# Patient Record
Sex: Female | Born: 1995 | Race: White | Hispanic: No | Marital: Married | Smoking: Never smoker
Health system: Southern US, Community
[De-identification: ages and names within clinical notes are randomized; demographics above are authoritative.]

## PROBLEM LIST (undated history)

## (undated) DIAGNOSIS — F419 Anxiety disorder, unspecified: Secondary | ICD-10-CM

## (undated) HISTORY — DX: Anxiety disorder, unspecified: F41.9

## (undated) HISTORY — PX: WISDOM TOOTH EXTRACTION: SHX21

---

## 2020-01-06 LAB — OB RESULTS CONSOLE HIV ANTIBODY (ROUTINE TESTING): HIV: NONREACTIVE

## 2020-01-06 LAB — OB RESULTS CONSOLE ABO/RH: RH Type: POSITIVE

## 2020-01-06 LAB — OB RESULTS CONSOLE RUBELLA ANTIBODY, IGM: Rubella: IMMUNE

## 2020-01-06 LAB — OB RESULTS CONSOLE RPR: RPR: NONREACTIVE

## 2020-01-06 LAB — OB RESULTS CONSOLE HEPATITIS B SURFACE ANTIGEN: Hepatitis B Surface Ag: NEGATIVE

## 2020-01-06 LAB — OB RESULTS CONSOLE ANTIBODY SCREEN: Antibody Screen: NEGATIVE

## 2020-01-12 ENCOUNTER — Inpatient Hospital Stay (HOSPITAL_COMMUNITY)
Admission: AD | Admit: 2020-01-12 | Payer: BC Managed Care – PPO | Source: Home / Self Care | Admitting: Obstetrics and Gynecology

## 2020-01-20 LAB — OB RESULTS CONSOLE GC/CHLAMYDIA
Chlamydia: NEGATIVE
Gonorrhea: NEGATIVE

## 2020-04-15 ENCOUNTER — Encounter: Payer: Self-pay | Admitting: *Deleted

## 2020-04-20 ENCOUNTER — Ambulatory Visit: Payer: 59 | Admitting: *Deleted

## 2020-04-20 ENCOUNTER — Encounter: Payer: Self-pay | Admitting: *Deleted

## 2020-04-20 ENCOUNTER — Other Ambulatory Visit (HOSPITAL_COMMUNITY): Payer: Self-pay | Admitting: Obstetrics and Gynecology

## 2020-04-20 ENCOUNTER — Ambulatory Visit: Payer: 59 | Attending: Obstetrics and Gynecology

## 2020-04-20 ENCOUNTER — Other Ambulatory Visit: Payer: Self-pay

## 2020-04-20 DIAGNOSIS — Z3A23 23 weeks gestation of pregnancy: Secondary | ICD-10-CM | POA: Diagnosis not present

## 2020-04-20 DIAGNOSIS — O99212 Obesity complicating pregnancy, second trimester: Secondary | ICD-10-CM | POA: Insufficient documentation

## 2020-04-20 DIAGNOSIS — E669 Obesity, unspecified: Secondary | ICD-10-CM | POA: Diagnosis not present

## 2020-04-20 DIAGNOSIS — Z363 Encounter for antenatal screening for malformations: Secondary | ICD-10-CM | POA: Diagnosis not present

## 2020-06-01 ENCOUNTER — Encounter (HOSPITAL_COMMUNITY): Payer: Self-pay | Admitting: Obstetrics and Gynecology

## 2020-06-01 ENCOUNTER — Other Ambulatory Visit: Payer: Self-pay

## 2020-06-01 ENCOUNTER — Inpatient Hospital Stay (HOSPITAL_COMMUNITY)
Admission: AD | Admit: 2020-06-01 | Discharge: 2020-06-01 | Disposition: A | Discharge status: Home / Self Care | Payer: 59 | Attending: Obstetrics and Gynecology | Admitting: Obstetrics and Gynecology

## 2020-06-01 DIAGNOSIS — O99343 Other mental disorders complicating pregnancy, third trimester: Secondary | ICD-10-CM | POA: Diagnosis not present

## 2020-06-01 DIAGNOSIS — Z79899 Other long term (current) drug therapy: Secondary | ICD-10-CM | POA: Insufficient documentation

## 2020-06-01 DIAGNOSIS — O212 Late vomiting of pregnancy: Secondary | ICD-10-CM | POA: Insufficient documentation

## 2020-06-01 DIAGNOSIS — Z3A29 29 weeks gestation of pregnancy: Secondary | ICD-10-CM | POA: Insufficient documentation

## 2020-06-01 DIAGNOSIS — U071 COVID-19: Secondary | ICD-10-CM | POA: Insufficient documentation

## 2020-06-01 DIAGNOSIS — O98513 Other viral diseases complicating pregnancy, third trimester: Secondary | ICD-10-CM

## 2020-06-01 DIAGNOSIS — F419 Anxiety disorder, unspecified: Secondary | ICD-10-CM | POA: Diagnosis not present

## 2020-06-01 LAB — URINALYSIS, ROUTINE W REFLEX MICROSCOPIC
Glucose, UA: 100 mg/dL — AB
Hgb urine dipstick: NEGATIVE
Ketones, ur: >80 mg/dL — AB
Nitrite: NEGATIVE
Protein, ur: 100 mg/dL — AB
Specific Gravity, Urine: 1.015 (ref 1.005–1.030)
pH: 6.5 (ref 5.0–8.0)

## 2020-06-01 LAB — URINALYSIS, MICROSCOPIC (REFLEX): Squamous Epithelial / HPF: >50 (ref 0–5)

## 2020-06-01 LAB — SARS CORONAVIRUS 2 BY RT PCR (HOSPITAL ORDER, PERFORMED IN ~~LOC~~ HOSPITAL LAB): SARS Coronavirus 2: POSITIVE — AB

## 2020-06-01 MED ORDER — ONDANSETRON HCL 4 MG PO TABS: 4.0000 mg | ORAL_TABLET | Freq: Three times a day (TID) | ORAL | 0 refills | Status: DC | PRN

## 2020-06-01 MED ORDER — ONDANSETRON 4 MG PO TBDP: 4.0000 mg | ORAL_TABLET | Freq: Four times a day (QID) | ORAL | 0 refills | Status: DC | PRN

## 2020-06-01 MED ORDER — SODIUM CHLORIDE 0.9 % IV SOLN
INTRAVENOUS | Status: DC | PRN
  Administered 2020-06-01: 1000 mL via INTRAVENOUS

## 2020-06-01 MED ORDER — METHYLPREDNISOLONE SODIUM SUCC 125 MG IJ SOLR: 125.0000 mg | Freq: Once | INTRAMUSCULAR | Status: DC | PRN

## 2020-06-01 MED ORDER — LACTATED RINGERS IV SOLN: INTRAVENOUS | Status: DC

## 2020-06-01 MED ORDER — EPINEPHRINE 0.3 MG/0.3ML IJ SOAJ
0.3000 mg | Freq: Once | INTRAMUSCULAR | Status: DC | PRN
  Filled 2020-06-01: qty 0.6

## 2020-06-01 MED ORDER — DIPHENHYDRAMINE HCL 50 MG/ML IJ SOLN: 50.0000 mg | Freq: Once | INTRAMUSCULAR | Status: DC | PRN

## 2020-06-01 MED ORDER — ONDANSETRON HCL 4 MG/2ML IJ SOLN
4.0000 mg | Freq: Once | INTRAMUSCULAR | Status: AC
  Administered 2020-06-01: 4 mg via INTRAVENOUS
  Filled 2020-06-01: qty 2

## 2020-06-01 MED ORDER — FAMOTIDINE 20 MG PO TABS: 20.0000 mg | ORAL_TABLET | Freq: Two times a day (BID) | ORAL | 1 refills | Status: AC

## 2020-06-01 MED ORDER — ALBUTEROL SULFATE HFA 108 (90 BASE) MCG/ACT IN AERS
2.0000 | INHALATION_SPRAY | Freq: Once | RESPIRATORY_TRACT | Status: DC | PRN
  Filled 2020-06-01: qty 6.7

## 2020-06-01 MED ORDER — SODIUM CHLORIDE 0.9 % IV SOLN
1200.0000 mg | Freq: Once | INTRAVENOUS | Status: AC
  Administered 2020-06-01: 1200 mg via INTRAVENOUS
  Filled 2020-06-01: qty 10

## 2020-06-01 MED ORDER — FAMOTIDINE IN NACL 20-0.9 MG/50ML-% IV SOLN
20.0000 mg | Freq: Once | INTRAVENOUS | Status: AC | PRN
  Administered 2020-06-01: 20 mg via INTRAVENOUS
  Filled 2020-06-01: qty 50

## 2020-06-01 MED ORDER — LACTATED RINGERS IV BOLUS
1000.0000 mL | Freq: Once | INTRAVENOUS | Status: AC
  Administered 2020-06-01: 1000 mL via INTRAVENOUS

## 2020-06-01 NOTE — Discharge Instructions (Signed)
COVID-19 Frequently Asked Questions COVID-19 (coronavirus disease) is an infection that is caused by a large family of viruses. Some viruses cause illness in people and others cause illness in animals like camels, cats, and bats. In some cases, the viruses that cause illness in animals can spread to humans. Where did the coronavirus come from? In December 2019, [place] told the [facility]) of several cases of lung disease (human respiratory illness). These cases were linked to an open seafood and livestock market in the city of [place]. The link to the seafood and livestock market suggests that the virus may have spread from animals to humans. However, since that first outbreak in December, the virus has also been shown to spread from person to person. What is the name of the disease and the virus? Disease name Early on, this disease was called novel coronavirus. This is because scientists determined that the disease was caused by a new (novel) respiratory virus. The World Health Organization [facility]) has now named the disease COVID-19, or coronavirus disease. Virus name The virus that causes the disease is called severe acute respiratory syndrome coronavirus 2 (SARS-CoV-2). More information on disease and virus naming World Health Organization [facility]): www.who.int/emergencies/diseases/novel-coronavirus-2019/technical-guidance/naming-the-coronavirus-disease-(covid-2019)-and-the-virus-that-causes-it Who is at risk for complications from coronavirus disease? Some people may be at higher risk for complications from coronavirus disease. This includes older adults and people who have chronic diseases, such as heart disease, diabetes, and lung disease. If you are at higher risk for complications, take these extra precautions:  Stay home as much as possible.  Avoid social gatherings and travel.  Avoid close contact with others. Stay at least 6 ft (2 m) away from others, if possible.  Wash  your hands often with soap and water for at least 20 seconds.  Avoid touching your face, mouth, nose, or eyes.  Keep supplies on hand at home, such as food, medicine, and cleaning supplies.  If you must go out in public, wear a cloth face covering or face mask. Make sure your mask covers your nose and mouth. How does coronavirus disease spread? The virus that causes coronavirus disease spreads easily from person to person (is contagious). You may catch the virus by:  Breathing in droplets from an infected person. Droplets can be spread by a person breathing, speaking, singing, coughing, or sneezing.  Touching something, like a table or a doorknob, that was exposed to the virus (contaminated) and then touching your mouth, nose, or eyes. Can I get the virus from touching surfaces or objects? There is still a lot that we do not know about the virus that causes coronavirus disease. Scientists are basing a lot of information on what they know about similar viruses, such as:  Viruses cannot generally survive on surfaces for long. They need a human body (host) to survive.  It is more likely that the virus is spread by close contact with people who are sick (direct contact), such as through: ? Shaking hands or hugging. ? Breathing in respiratory droplets that travel through the air. Droplets can be spread by a person breathing, speaking, singing, coughing, or sneezing.  It is less likely that the virus is spread when a person touches a surface or object that has the virus on it (indirect contact). The virus may be able to enter the body if the person touches a surface or object and then touches his or her face, eyes, nose, or mouth. Can a person spread the virus without having symptoms of the disease?  It may be possible for the virus to spread before a person has symptoms of the disease, but this is most likely not the main way the virus is spreading. It is more likely for the virus to spread by  being in close contact with people who are sick and breathing in the respiratory droplets spread by a person breathing, speaking, singing, coughing, or sneezing. °What are the symptoms of coronavirus disease? °Symptoms vary from person to person and can range from mild to severe. Symptoms may include: °· Fever or chills. °· Cough. °· Difficulty breathing or feeling short of breath. °· Headaches, body aches, or muscle aches. °· Runny or stuffy (congested) nose. °· Sore throat. °· New loss of taste or smell. °· Nausea, vomiting, or diarrhea. °These symptoms can appear anywhere from 2 to 14 days after you have been exposed to the virus. Some people may not have any symptoms. If you develop symptoms, call your health care provider. People with severe symptoms may need hospital care. °Should I be tested for this virus? °Your health care provider will decide whether to test you based on your symptoms, history of exposure, and your risk factors. °How does a health care provider test for this virus? °Health care providers will collect samples to send for testing. Samples may include: °· Taking a swab of fluid from the back of your nose and throat, your nose, or your throat. °· Taking fluid from the lungs by having you cough up mucus (sputum) into a sterile cup. °· Taking a blood sample. °Is there a treatment or vaccine for this virus? °Currently, there is no vaccine to prevent coronavirus disease. Also, there are no medicines like antibiotics or antivirals to treat the virus. A person who becomes sick is given supportive care, which means rest and fluids. A person may also relieve his or her symptoms by using over-the-counter medicines that treat sneezing, coughing, and runny nose. These are the same medicines that a person takes for the common cold. °If you develop symptoms, call your health care provider. People with severe symptoms may need hospital care. °What can I do to protect myself and my family from this  virus? ° °  ° °You can protect yourself and your family by taking the same actions that you would take to prevent the spread of other viruses. Take the following actions: °· Wash your hands often with soap and water for at least 20 seconds. If soap and water are not available, use alcohol-based hand sanitizer. °· Avoid touching your face, mouth, nose, or eyes. °· Cough or sneeze into a tissue, sleeve, or elbow. Do not cough or sneeze into your hand or the air. °? If you cough or sneeze into a tissue, throw it away immediately and wash your hands. °· Disinfect objects and surfaces that you frequently touch every day. °· Stay away from people who are sick. °· Avoid going out in public, follow guidance from your state and local health authorities. °· Avoid crowded indoor spaces. Stay at least 6 ft (2 m) away from others. °· If you must go out in public, wear a cloth face covering or face mask. Make sure your mask covers your nose and mouth. °· Stay home if you are sick, except to get medical care. Call your health care provider before you get medical care. Your health care provider will tell you how long to stay home. °· Make sure your vaccines are up to date. Ask your health care provider what   vaccines you need. °What should I do if I need to travel? °Follow travel recommendations from your local health authority, the CDC, and WHO. °Travel information and advice °· Centers for Disease Control and Prevention (CDC): www.cdc.gov/coronavirus/2019-ncov/travelers/index.html °· World Health Organization (WHO): www.who.int/emergencies/diseases/novel-coronavirus-2019/travel-advice °Know the risks and take action to protect your health °· You are at higher risk of getting coronavirus disease if you are traveling to areas with an outbreak or if you are exposed to travelers from areas with an outbreak. °· Wash your hands often and practice good hygiene to lower the risk of catching or spreading the virus. °What should I do if I  am sick? °General instructions to stop the spread of infection °· Wash your hands often with soap and water for at least 20 seconds. If soap and water are not available, use alcohol-based hand sanitizer. °· Cough or sneeze into a tissue, sleeve, or elbow. Do not cough or sneeze into your hand or the air. °· If you cough or sneeze into a tissue, throw it away immediately and wash your hands. °· Stay home unless you must get medical care. Call your health care provider or local health authority before you get medical care. °· Avoid public areas. Do not take public transportation, if possible. °· If you can, wear a mask if you must go out of the house or if you are in close contact with someone who is not sick. Make sure your mask covers your nose and mouth. °Keep your home clean °· Disinfect objects and surfaces that are frequently touched every day. This may include: °? Counters and tables. °? Doorknobs and light switches. °? Sinks and faucets. °? Electronics such as phones, remote controls, keyboards, computers, and tablets. °· Wash dishes in hot, soapy water or use a dishwasher. Air-dry your dishes. °· Wash laundry in hot water. °Prevent infecting other household members °· Let healthy household members care for children and pets, if possible. If you have to care for children or pets, wash your hands often and wear a mask. °· Sleep in a different bedroom or bed, if possible. °· Do not share personal items, such as razors, toothbrushes, deodorant, combs, brushes, towels, and washcloths. °Where to find more information °Centers for Disease Control and Prevention (CDC) °· Information and news updates: www.cdc.gov/coronavirus/2019-ncov °World Health Organization (WHO) °· Information and news updates: www.who.int/emergencies/diseases/novel-coronavirus-2019 °· Coronavirus health topic: www.who.int/health-topics/coronavirus °· Questions and answers on COVID-19: www.who.int/news-room/q-a-detail/q-a-coronaviruses °· Global  tracker: who.sprinklr.com °American Academy of Pediatrics (AAP) °· Information for families: www.healthychildren.org/English/health-issues/conditions/chest-lungs/Pages/2019-Novel-Coronavirus.aspx °The coronavirus situation is changing rapidly. Check your local health authority website or the CDC and WHO websites for updates and news. °When should I contact a health care provider? °· Contact your health care provider if you have symptoms of an infection, such as fever or cough, and you: °? Have been near anyone who is known to have coronavirus disease. °? Have come into contact with a person who is suspected to have coronavirus disease. °? Have traveled to an area where there is an outbreak of COVID-19. °When should I get emergency medical care? °· Get help right away by calling your local emergency services (911 in the [place] if you have: °? Trouble breathing. °? Pain or pressure in your chest. °? Confusion. °? Blue-tinged lips and fingernails. °? Difficulty waking from sleep. °? Symptoms that get worse. °Let the emergency medical personnel know if you think you have coronavirus disease. °Summary °· A new respiratory virus is spreading from person to person and causing   COVID-19 (coronavirus disease).  The virus that causes COVID-19 appears to spread easily. It spreads from one person to another through droplets from breathing, speaking, singing, coughing, or sneezing.  Older adults and those with chronic diseases are at higher risk of disease. If you are at higher risk for complications, take extra precautions.  There is currently no vaccine to prevent coronavirus disease. There are no medicines, such as antibiotics or antivirals, to treat the virus.  You can protect yourself and your family by washing your hands often, avoiding touching your face, and covering your coughs and sneezes. This information is not intended to replace advice given to you by your health care provider. Make sure you discuss any  questions you have with your health care provider. Document Revised: 07/03/2019 Document Reviewed: 12/30/2018 Elsevier Patient Education  2020 Elsevier Inc.  10 Things You Can Do to Manage Your COVID-19 Symptoms at Home If you have possible or confirmed COVID-19: 1. Stay home from work and school. And stay away from other public places. If you must go out, avoid using any kind of public transportation, ridesharing, or taxis. 2. Monitor your symptoms carefully. If your symptoms get worse, call your healthcare provider immediately. 3. Get rest and stay hydrated. 4. If you have a medical appointment, call the healthcare provider ahead of time and tell them that you have or may have COVID-19. 5. For medical emergencies, call 911 and notify the dispatch personnel that you have or may have COVID-19. 6. Cover your cough and sneezes with a tissue or use the inside of your elbow. 7. Wash your hands often with soap and water for at least 20 seconds or clean your hands with an alcohol-based hand sanitizer that contains at least 60% alcohol. 8. As much as possible, stay in a specific room and away from other people in your home. Also, you should use a separate bathroom, if available. If you need to be around other people in or outside of the home, wear a mask. 9. Avoid sharing personal items with other people in your household, like dishes, towels, and bedding. 10. Clean all surfaces that are touched often, like counters, tabletops, and doorknobs. Use household cleaning sprays or wipes according to the label instructions. [email or web address] 03/18/2019 This information is not intended to replace advice given to you by your health care provider. Make sure you discuss any questions you have with your health care provider. Document Revised: 08/20/2019 Document Reviewed: 08/20/2019 Elsevier Patient Education  2020 Elsevier Inc.  COVID-19: Quarantine vs. Isolation QUARANTINE keeps someone who was in close  contact with someone who has COVID-19 away from others. If you had close contact with a person who has COVID-19  Stay home until 14 days after your last contact.  Check your temperature twice a day and watch for symptoms of COVID-19.  If possible, stay away from people who are at higher-risk for getting very sick from COVID-19. ISOLATION keeps someone who is sick or tested positive for COVID-19 without symptoms away from others, even in their own home. If you are sick and think or know you have COVID-19  Stay home until after ? At least 10 days since symptoms first appeared and ? At least 24 hours with no fever without fever-reducing medication and ? Symptoms have improved If you tested positive for COVID-19 but do not have symptoms  Stay home until after ? 10 days have passed since your positive test If you live with others, stay in a specific "sick room"  or area and away from other people or animals, including pets. Use a separate bathroom, if available. [email or web address] 04/06/2019 This information is not intended to replace advice given to you by your health care provider. Make sure you discuss any questions you have with your health care provider. Document Revised: 08/20/2019 Document Reviewed: 08/20/2019 Elsevier Patient Education  2020 [employer].

## 2020-06-01 NOTE — MAU Provider Note (Addendum)
History     CSN: [id number]  Arrival date and time: 06/01/20 1652   First Provider Initiated Contact with Patient 06/01/20 1818      Chief Complaint  Patient presents with  . Nausea  . Emesis   Deanna Russell is a 24 y.o. G1P0 at [redacted]w[redacted]d who receives care at [facility].  She presents today for Nausea and Emesis.  She states she started having cold symptoms last Tuesday or Wednesday.  She states her symptoms started with nasal and chest congestion.  She states she was using a Netty pot and mucinex for her symptoms and they were improving then the vomiting started.  She states on Saturday she had one incident of vomiting, but then had vomiting all day Sunday when she tried to eat. She endorses fetal movement and denies abdominal pain or contractions.  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    OB History    Gravida  1   Para      Term      Preterm      AB      Living        SAB      TAB      Ectopic      Multiple      Live Births              Past Medical History:  Diagnosis Date  . Anxiety     Past Surgical History:  Procedure Laterality Date  . WISDOM TOOTH EXTRACTION      History reviewed. No pertinent family history.  Social History   Tobacco Use  . Smoking status: Never Smoker  . Smokeless tobacco: Never Used  Vaping Use  . Vaping Use: Never used  Substance Use Topics  . Alcohol use: Never  . Drug use: Never    Allergies: No Known Allergies  Medications Prior to Admission  Medication Sig Dispense Refill Last Dose  . doxylamine, Sleep, (UNISOM) 25 MG tablet Take 25 mg by mouth at bedtime as needed.   05/31/2020 at Unknown time  . Prenatal Vit-Fe Fumarate-FA (PRENATAL VITAMIN PO) Take by mouth.   06/01/2020 at Unknown time  . SERTRALINE HCL PO Take by mouth.   06/01/2020 at Unknown time    Review of Systems  Constitutional: Positive for fatigue.  Negative for chills and fever.  HENT: Positive for congestion, sinus pressure and sinus pain. Negative for postnasal drip, rhinorrhea, sneezing and sore throat.   Respiratory: Positive for cough. Negative for chest tightness and shortness of breath.   Cardiovascular: Negative for chest pain.  Gastrointestinal: Positive for nausea and vomiting. Negative for abdominal pain, constipation and diarrhea.  Genitourinary: Negative for difficulty urinating, dysuria, pelvic pain, vaginal bleeding and vaginal discharge.  Musculoskeletal: Negative for back pain.  Neurological: Negative for dizziness, light-headedness and headaches.   Physical Exam   Blood pressure 122/82, pulse (!) 134, temperature 98.2 F (36.8 C), temperature source Oral, resp. rate 16, height 5\' 10"  (1.778 m), weight 122 kg, last menstrual period 11/09/2019, SpO2 96 %.  Physical Exam Constitutional:      Appearance: Normal appearance.  HENT:     Head: Normocephalic and atraumatic.  Eyes:     Conjunctiva/sclera: Conjunctivae normal.  Cardiovascular:     Rate and Rhythm: Normal rate and regular rhythm.     Heart sounds: Normal heart sounds.  Pulmonary:  Effort: Pulmonary effort is normal. No respiratory distress.  Abdominal:     General: Bowel sounds are normal.     Palpations: Abdomen is soft.     Tenderness: There is no abdominal tenderness.  Musculoskeletal:        General: Normal range of motion.     Cervical back: Normal range of motion.  Skin:    General: Skin is warm and dry.  Neurological:     Mental Status: She is alert and oriented to person, place, and time.  Psychiatric:        Mood and Affect: Mood normal.        Behavior: Behavior normal.        Thought Content: Thought content normal.     Fetal Assessment 165 bpm, Mod Var, -Decels, +15x15 Accels Toco: None  MAU Course   Results for orders placed or performed during the hospital encounter of 06/01/20 (from the past 24 hour(s))  Urinalysis,  Routine w reflex microscopic Nasopharyngeal Swab     Status: Abnormal   Collection Time: 06/01/20  5:22 PM  Result Value Ref Range   Color, Urine ORANGE (A) YELLOW   APPearance TURBID (A) CLEAR   Specific Gravity, Urine 1.015 1.005 - 1.030   pH 6.5 5.0 - 8.0   Glucose, UA 100 (A) NEGATIVE mg/dL   Hgb urine dipstick NEGATIVE NEGATIVE   Bilirubin Urine MODERATE (A) NEGATIVE   Ketones, ur >80 (A) NEGATIVE mg/dL   Protein, ur 938 (A) NEGATIVE mg/dL   Nitrite NEGATIVE NEGATIVE   Leukocytes,Ua MODERATE (A) NEGATIVE  Urinalysis, Microscopic (reflex)     Status: Abnormal   Collection Time: 06/01/20  5:22 PM  Result Value Ref Range   RBC / HPF 0-5 0 - 5 RBC/hpf   WBC, UA 21-50 0 - 5 WBC/hpf   Bacteria, UA MANY (A) NONE SEEN   Squamous Epithelial / LPF >50 0 - 5   Mucus PRESENT    Hyaline Casts, UA PRESENT   SARS Coronavirus 2 by RT PCR (hospital order, performed in [facility] hospital lab) Nasopharyngeal Nasopharyngeal Swab     Status: Abnormal   Collection Time: 06/01/20  5:29 PM   Specimen: Nasopharyngeal Swab  Result Value Ref Range   SARS Coronavirus 2 POSITIVE (A) NEGATIVE   No results found.  MDM PE Labs:UA, UC EFM  Assessment and Plan  24 year old G1P0  SIUP at 29.2weeks Cat I FT Nasal Congestion Vomiting  -Nurse reports fetal tachycardia.  LR bolus ordered.  -POC Reviewed -Exam performed and findings discussed. -Will send urine for culture. -Patient s/p one bag of LR and fetal heart rate improving; from 180s to 165. -Provider discusses recoommendation of mAB infusion if Covid positive.  Informed that study is investigational and does not cure Covid.  Reviewed risks precautions including pregnancy and common side effects including.  . Allergic reactions, such as hives or itching . Flu-like signs and symptoms, including chills, fatigue, fever, and muscle aches and pains . Nausea, vomiting . Diarrhea . Skin rashes . Low blood pressure  -Patient without further  questions or concerns.  -Will await results.    Cherre Robins MSN, CNM 06/01/2020, 6:18 PM   Reassessment (7:07 PM) Covid Positive  -Provider to bedside to discuss results.  -Discussed usage of mAB infusion.  Patient agrees.  -Patient and husband questions if husband, who is s/p 2nd Covid Vaccine x 2 week, should get tested. -Provider instructs husband to follow up with his PCP, but that  it should not be necessary if he is asymptomatic.  However, he should quarantine for 14 days.  -Patient without further questions or concerns. -Informed of procedure for mAB infusion: 30 minute for preparation, 21 minutes for infusion, and 1 hour for observation. -Nurse instructed to continue LR infusion as FHR continues to improve. Now 150 bpm, Mod Var, -Decels, +Accels  Reassessment (7:55 PM) Report given to oncoming provider-M. Mayford Knife, CNM  Cherre Robins MSN, CNM Advanced Practice Provider, [facility]  Patient did well post infusion Had some heartburn and wants Rx for Pepcid for home use Will discharge home Encouraged to return here or to other Urgent Care/ED if she develops worsening of symptoms, increase in pain, fever, or other concerning symptoms.    Aviva Signs, CNM

## 2020-06-01 NOTE — MAU Note (Signed)
.   Deanna Russell is a 24 y.o. at [redacted]w[redacted]d here in MAU reporting: she started having vomiting with a cold on Saturday. States she started feeling really tired on Friday. Denies any concerns with the pregnancy LMP:  Onset of complaint: Friday Pain score: 0 Vitals:   06/01/20 1717  BP: 122/82  Pulse: (!) 134  Resp: 16  Temp: 98.2 F (36.8 C)  SpO2: 96%     FHT:165 Lab orders placed from triage: UA

## 2020-06-02 LAB — URINE CULTURE: Culture: 10000 — AB

## 2020-07-13 LAB — OB RESULTS CONSOLE GBS: GBS: NEGATIVE

## 2020-08-16 NOTE — H&P (Signed)
Deanna Russell is a 24 y.o. female presenting for IOL. Pregnancy complicated by anxiety disorder on sertraline. OB History    Gravida  1   Para      Term      Preterm      AB      Living        SAB      TAB      Ectopic      Multiple      Live Births             Past Medical History:  Diagnosis Date  . Anxiety    Past Surgical History:  Procedure Laterality Date  . WISDOM TOOTH EXTRACTION     Family History: family history is not on file. Social History:  reports that she has never smoked. She has never used smokeless tobacco. She reports that she does not drink alcohol and does not use drugs.     Maternal Diabetes: No Genetic Screening: Normal Maternal Ultrasounds/Referrals: Normal Fetal Ultrasounds or other Referrals:  None Maternal Substance Abuse:  No Significant Maternal Medications:  Meds include: Zoloft Significant Maternal Lab Results:  Group B Strep negative Other Comments:  None  Review of Systems  Constitutional: Negative for fever.  Eyes: Negative for visual disturbance.  Gastrointestinal: Negative for abdominal pain.  Neurological: Negative for headaches.   Maternal Medical History:  Fetal activity: Perceived fetal activity is normal.        Last menstrual period 11/09/2019. Maternal Exam:  Abdomen: Fetal presentation: vertex     Physical Exam Cardiovascular:     Rate and Rhythm: Normal rate.  Pulmonary:     Effort: Pulmonary effort is normal.    Cx 4/100/0  Prenatal labs: ABO, Rh:   Antibody:   Rubella:   RPR:    HBsAg:    HIV:    GBS:   negative  07/13/20  Assessment/Plan: 24 yo G1P0 @ 40 2/7 wks for IOL   Deanna Russell 08/16/2020, 6:13 PM

## 2020-08-17 ENCOUNTER — Encounter (HOSPITAL_COMMUNITY): Payer: Self-pay | Admitting: Obstetrics and Gynecology

## 2020-08-17 ENCOUNTER — Inpatient Hospital Stay (HOSPITAL_COMMUNITY): Payer: 59

## 2020-08-17 ENCOUNTER — Inpatient Hospital Stay (HOSPITAL_COMMUNITY)
Admission: AD | Admit: 2020-08-17 | Discharge: 2020-08-19 | DRG: 807 | Disposition: A | Discharge status: Home / Self Care | Payer: 59 | Attending: Obstetrics and Gynecology | Admitting: Obstetrics and Gynecology

## 2020-08-17 ENCOUNTER — Inpatient Hospital Stay (HOSPITAL_COMMUNITY): Payer: 59 | Admitting: Anesthesiology

## 2020-08-17 ENCOUNTER — Other Ambulatory Visit: Payer: Self-pay

## 2020-08-17 DIAGNOSIS — Z3A40 40 weeks gestation of pregnancy: Secondary | ICD-10-CM

## 2020-08-17 DIAGNOSIS — O26893 Other specified pregnancy related conditions, third trimester: Secondary | ICD-10-CM | POA: Diagnosis present

## 2020-08-17 DIAGNOSIS — O480 Post-term pregnancy: Principal | ICD-10-CM | POA: Diagnosis present

## 2020-08-17 DIAGNOSIS — F419 Anxiety disorder, unspecified: Secondary | ICD-10-CM | POA: Diagnosis present

## 2020-08-17 DIAGNOSIS — O99344 Other mental disorders complicating childbirth: Secondary | ICD-10-CM | POA: Diagnosis present

## 2020-08-17 LAB — CBC
HCT: 36.2 % (ref 36.0–46.0)
Hemoglobin: 11.8 g/dL — ABNORMAL LOW (ref 12.0–15.0)
MCH: 29.4 pg (ref 26.0–34.0)
MCHC: 32.6 g/dL (ref 30.0–36.0)
MCV: 90 fL (ref 80.0–100.0)
Platelets: 312 10*3/uL (ref 150–400)
RBC: 4.02 MIL/uL (ref 3.87–5.11)
RDW: 14 % (ref 11.5–15.5)
WBC: 11 10*3/uL — ABNORMAL HIGH (ref 4.0–10.5)
nRBC: 0 % (ref 0.0–0.2)

## 2020-08-17 LAB — RPR: RPR Ser Ql: NONREACTIVE

## 2020-08-17 LAB — TYPE AND SCREEN
ABO/RH(D): B POS
Antibody Screen: NEGATIVE

## 2020-08-17 MED ORDER — BENZOCAINE-MENTHOL 20-0.5 % EX AERO
1.0000 "application " | INHALATION_SPRAY | CUTANEOUS | Status: DC | PRN
  Administered 2020-08-19: 1 via TOPICAL
  Filled 2020-08-17: qty 56

## 2020-08-17 MED ORDER — COCONUT OIL OIL: 1.0000 "application " | TOPICAL_OIL | Status: DC | PRN

## 2020-08-17 MED ORDER — WITCH HAZEL-GLYCERIN EX PADS: 1.0000 "application " | MEDICATED_PAD | CUTANEOUS | Status: DC | PRN

## 2020-08-17 MED ORDER — TETANUS-DIPHTH-ACELL PERTUSSIS 5-2.5-18.5 LF-MCG/0.5 IM SUSY: 0.5000 mL | PREFILLED_SYRINGE | Freq: Once | INTRAMUSCULAR | Status: DC

## 2020-08-17 MED ORDER — PHENYLEPHRINE 40 MCG/ML (10ML) SYRINGE FOR IV PUSH (FOR BLOOD PRESSURE SUPPORT): 80.0000 ug | PREFILLED_SYRINGE | INTRAVENOUS | Status: DC | PRN

## 2020-08-17 MED ORDER — EPHEDRINE 5 MG/ML INJ: 10.0000 mg | INTRAVENOUS | Status: DC | PRN

## 2020-08-17 MED ORDER — ONDANSETRON HCL 4 MG/2ML IJ SOLN: 4.0000 mg | INTRAMUSCULAR | Status: DC | PRN

## 2020-08-17 MED ORDER — FENTANYL-BUPIVACAINE-NACL 0.5-0.125-0.9 MG/250ML-% EP SOLN
12.0000 mL/h | EPIDURAL | Status: DC | PRN
  Filled 2020-08-17: qty 250

## 2020-08-17 MED ORDER — IBUPROFEN 600 MG PO TABS
600.0000 mg | ORAL_TABLET | Freq: Four times a day (QID) | ORAL | Status: DC
  Administered 2020-08-17 – 2020-08-19 (×8): 600 mg via ORAL
  Filled 2020-08-17 (×8): qty 1

## 2020-08-17 MED ORDER — ZOLPIDEM TARTRATE 5 MG PO TABS: 5.0000 mg | ORAL_TABLET | Freq: Every evening | ORAL | Status: DC | PRN

## 2020-08-17 MED ORDER — LACTATED RINGERS IV SOLN: 500.0000 mL | INTRAVENOUS | Status: DC | PRN

## 2020-08-17 MED ORDER — DIPHENHYDRAMINE HCL 50 MG/ML IJ SOLN: 12.5000 mg | INTRAMUSCULAR | Status: DC | PRN

## 2020-08-17 MED ORDER — LIDOCAINE HCL (PF) 1 % IJ SOLN: 30.0000 mL | INTRAMUSCULAR | Status: DC | PRN

## 2020-08-17 MED ORDER — FLEET ENEMA 7-19 GM/118ML RE ENEM: 1.0000 | ENEMA | RECTAL | Status: DC | PRN

## 2020-08-17 MED ORDER — OXYCODONE-ACETAMINOPHEN 5-325 MG PO TABS: 1.0000 | ORAL_TABLET | ORAL | Status: DC | PRN

## 2020-08-17 MED ORDER — LIDOCAINE HCL (PF) 1 % IJ SOLN
INTRAMUSCULAR | Status: DC | PRN
  Administered 2020-08-17: 5 mL via EPIDURAL

## 2020-08-17 MED ORDER — SERTRALINE HCL 50 MG PO TABS
50.0000 mg | ORAL_TABLET | Freq: Every day | ORAL | Status: DC
  Administered 2020-08-18 – 2020-08-19 (×2): 50 mg via ORAL
  Filled 2020-08-17 (×2): qty 1

## 2020-08-17 MED ORDER — DIBUCAINE (PERIANAL) 1 % EX OINT: 1.0000 "application " | TOPICAL_OINTMENT | CUTANEOUS | Status: DC | PRN

## 2020-08-17 MED ORDER — OXYCODONE HCL 5 MG PO TABS: 10.0000 mg | ORAL_TABLET | ORAL | Status: DC | PRN

## 2020-08-17 MED ORDER — ACETAMINOPHEN 325 MG PO TABS: 650.0000 mg | ORAL_TABLET | ORAL | Status: DC | PRN

## 2020-08-17 MED ORDER — ONDANSETRON HCL 4 MG PO TABS: 4.0000 mg | ORAL_TABLET | ORAL | Status: DC | PRN

## 2020-08-17 MED ORDER — SOD CITRATE-CITRIC ACID 500-334 MG/5ML PO SOLN
30.0000 mL | ORAL | Status: DC | PRN
  Administered 2020-08-17: 30 mL via ORAL
  Filled 2020-08-17: qty 15

## 2020-08-17 MED ORDER — OXYTOCIN-SODIUM CHLORIDE 30-0.9 UT/500ML-% IV SOLN
2.5000 [IU]/h | INTRAVENOUS | Status: DC
  Filled 2020-08-17: qty 500

## 2020-08-17 MED ORDER — OXYCODONE HCL 5 MG PO TABS: 5.0000 mg | ORAL_TABLET | ORAL | Status: DC | PRN

## 2020-08-17 MED ORDER — ONDANSETRON HCL 4 MG/2ML IJ SOLN
4.0000 mg | Freq: Four times a day (QID) | INTRAMUSCULAR | Status: DC | PRN
  Administered 2020-08-17: 4 mg via INTRAVENOUS
  Filled 2020-08-17: qty 2

## 2020-08-17 MED ORDER — SODIUM CHLORIDE (PF) 0.9 % IJ SOLN
INTRAMUSCULAR | Status: DC | PRN
  Administered 2020-08-17: 12 mL/h via EPIDURAL

## 2020-08-17 MED ORDER — SENNOSIDES-DOCUSATE SODIUM 8.6-50 MG PO TABS
2.0000 | ORAL_TABLET | ORAL | Status: DC
  Administered 2020-08-18 (×2): 2 via ORAL
  Filled 2020-08-17 (×2): qty 2

## 2020-08-17 MED ORDER — LACTATED RINGERS IV SOLN: INTRAVENOUS | Status: DC

## 2020-08-17 MED ORDER — PRENATAL MULTIVITAMIN CH
1.0000 | ORAL_TABLET | Freq: Every day | ORAL | Status: DC
  Administered 2020-08-18 – 2020-08-19 (×2): 1 via ORAL
  Filled 2020-08-17 (×2): qty 1

## 2020-08-17 MED ORDER — SIMETHICONE 80 MG PO CHEW: 80.0000 mg | CHEWABLE_TABLET | ORAL | Status: DC | PRN

## 2020-08-17 MED ORDER — TERBUTALINE SULFATE 1 MG/ML IJ SOLN: 0.2500 mg | Freq: Once | INTRAMUSCULAR | Status: DC | PRN

## 2020-08-17 MED ORDER — OXYTOCIN-SODIUM CHLORIDE 30-0.9 UT/500ML-% IV SOLN
1.0000 m[IU]/min | INTRAVENOUS | Status: DC
  Administered 2020-08-17: 2 m[IU]/min via INTRAVENOUS

## 2020-08-17 MED ORDER — DIPHENHYDRAMINE HCL 25 MG PO CAPS: 25.0000 mg | ORAL_CAPSULE | Freq: Four times a day (QID) | ORAL | Status: DC | PRN

## 2020-08-17 MED ORDER — FENTANYL CITRATE (PF) 100 MCG/2ML IJ SOLN: 50.0000 ug | INTRAMUSCULAR | Status: DC | PRN

## 2020-08-17 MED ORDER — OXYTOCIN BOLUS FROM INFUSION
333.0000 mL | Freq: Once | INTRAVENOUS | Status: AC
  Administered 2020-08-17: 333 mL via INTRAVENOUS

## 2020-08-17 MED ORDER — OXYCODONE-ACETAMINOPHEN 5-325 MG PO TABS: 2.0000 | ORAL_TABLET | ORAL | Status: DC | PRN

## 2020-08-17 MED ORDER — LACTATED RINGERS IV SOLN: 500.0000 mL | Freq: Once | INTRAVENOUS | Status: DC

## 2020-08-17 NOTE — Anesthesia Preprocedure Evaluation (Signed)
Anesthesia Evaluation  Patient identified by MRN, date of birth, ID band Patient awake    Reviewed: Allergy & Precautions, NPO status , Patient's Chart, lab work & pertinent test results  Airway Mallampati: II  TM Distance: >3 FB Neck ROM: Full    Dental no notable dental hx. (+) Teeth Intact, Dental Advisory Given   Pulmonary neg pulmonary ROS,    Pulmonary exam normal breath sounds clear to auscultation       Cardiovascular Exercise Tolerance: Good negative cardio ROS Normal cardiovascular exam Rhythm:Regular Rate:Normal     Neuro/Psych Anxiety negative neurological ROS     GI/Hepatic negative GI ROS, Neg liver ROS,   Endo/Other  negative endocrine ROS  Renal/GU negative Renal ROS     Musculoskeletal negative musculoskeletal ROS (+)   Abdominal   Peds  Hematology Lab Results      Component                Value               Date                      WBC                      11.0 (H)            08/17/2020                HGB                      11.8 (L)            08/17/2020                HCT                      36.2                08/17/2020                MCV                      90.0                08/17/2020                PLT                      312                 08/17/2020              Anesthesia Other Findings   Reproductive/Obstetrics (+) Pregnancy                             Anesthesia Physical Anesthesia Plan  ASA: III  Anesthesia Plan: Epidural   Post-op Pain Management:    Induction:   PONV Risk Score and Plan:   Airway Management Planned:   Additional Equipment:   Intra-op Plan:   Post-operative Plan:   Informed Consent: I have reviewed the patients History and Physical, chart, labs and discussed the procedure including the risks, benefits and alternatives for the proposed anesthesia with the patient or authorized representative who has indicated  his/her understanding and acceptance.       Plan Discussed with:   Anesthesia Plan  Comments: (40.2 Wk G1P0  For LEA)        Anesthesia Quick Evaluation

## 2020-08-17 NOTE — Progress Notes (Signed)
Delivery Note At 1:47 PM a viable female was delivered via Vaginal, Spontaneous (Presentation: Left Occiput Anterior).  APGAR: 9, 9; weight  .   Placenta status: Spontaneous, Intact.  Cord: 3 vessels with the following complications: None.  Cord pH:   Anesthesia: Epidural Episiotomy: None Lacerations: 2nd degree;Periurethral bilat-repaired Suture Repair: 2.0 vicryl rapide Est. Blood Loss (mL): 250  Mom to postpartum.  Baby to Couplet care / Skin to Skin.  Deanna Russell 08/17/2020, 2:15 PM

## 2020-08-17 NOTE — Lactation Note (Addendum)
This note was copied from a baby's chart. Lactation Consultation Note  Patient Name: Deanna Russell Date: 08/17/2020  Mom attempting to bf on arrival.  Baby Deanna Badgett now 7 hours old.  Hx low blood sugar past delivery.Parents report no breastfeeding education. Mom reports she has a DEBP for home use.  Mom using breastfeeding pillow on arrival.   Mom has also used nipple shield. Mom trying without nipple shield at this feed.  Assisted in feeding without nipple shield.  Repositioned infants arm out from in front of him.  Mom had good positioning of infant overall.   Infant will latch and suckle and come off and let go.  Did this a few times and LC applied 20 mm nipple shield.  Infant latched and maintained better.  Did some pursing of his lips at the breast on the nipple shield. Attempted with 24 mm shield. Infant would not maintain rythmic suckling.  Showed dad how infant needed to be all the way down on the shield.  Urged parents to always try without nipple shield first. Initiated pumping with mom due to nipple shield use.  Mom has [facility] breastfeeding Consultation Booklet for home use,  Urged to call lactation services as needed   Maternal Data    Feeding Feeding Type: Formula Nipple Type: Slow - flow  LATCH Score                   Interventions    Lactation Tools Discussed/Used     Consult Status      Deanna Russell 08/17/2020, 10:16 PM

## 2020-08-17 NOTE — Anesthesia Procedure Notes (Signed)
Epidural Patient location during procedure: OB Start time: 08/17/2020 10:38 AM End time: 08/17/2020 10:50 AM  Staffing Anesthesiologist: Trevor Iha, MD Performed: anesthesiologist   Preanesthetic Checklist Completed: patient identified, IV checked, site marked, risks and benefits discussed, surgical consent, monitors and equipment checked, pre-op evaluation and timeout performed  Epidural Patient position: sitting Prep: DuraPrep and site prepped and draped Patient monitoring: continuous pulse ox and blood pressure Approach: midline Location: L3-L4 Injection technique: LOR air  Needle:  Needle type: Tuohy  Needle gauge: 17 G Needle length: 9 cm and 9 Needle insertion depth: 7 cm Catheter type: closed end flexible Catheter size: 19 Gauge Catheter at skin depth: 13 cm Test dose: negative  Assessment Events: blood not aspirated, injection not painful, no injection resistance, no paresthesia and negative IV test  Additional Notes Patient identified. Risks/Benefits/Options discussed with patient including but not limited to bleeding, infection, nerve damage, paralysis, failed block, incomplete pain control, headache, blood pressure changes, nausea, vomiting, reactions to medication both or allergic, itching and postpartum back pain. Confirmed with bedside nurse the patient's most recent platelet count. Confirmed with patient that they are not currently taking any anticoagulation, have any bleeding history or any family history of bleeding disorders. Patient expressed understanding and wished to proceed. All questions were answered. Sterile technique was used throughout the entire procedure. Please see nursing notes for vital signs. Test dose was given through epidural needle and negative prior to continuing to dose epidural or start infusion. Warning signs of high block given to the patient including shortness of breath, tingling/numbness in hands, complete motor block, or any  concerning symptoms with instructions to call for help. Patient was given instructions on fall risk and not to get out of bed. All questions and concerns addressed with instructions to call with any issues. 1 Attempt (S) . Patient tolerated procedure well.

## 2020-08-17 NOTE — Progress Notes (Signed)
No changes to H&P per patient history. She wants to start pitocin now Cx 4/C/+1/vtx AROM>scant clear fluid FHT cat one D/W her birth plan

## 2020-08-18 LAB — CBC
HCT: 30.7 % — ABNORMAL LOW (ref 36.0–46.0)
Hemoglobin: 10 g/dL — ABNORMAL LOW (ref 12.0–15.0)
MCH: 29.2 pg (ref 26.0–34.0)
MCHC: 32.6 g/dL (ref 30.0–36.0)
MCV: 89.5 fL (ref 80.0–100.0)
Platelets: 228 10*3/uL (ref 150–400)
RBC: 3.43 MIL/uL — ABNORMAL LOW (ref 3.87–5.11)
RDW: 14 % (ref 11.5–15.5)
WBC: 12.3 10*3/uL — ABNORMAL HIGH (ref 4.0–10.5)
nRBC: 0 % (ref 0.0–0.2)

## 2020-08-18 NOTE — Lactation Note (Signed)
This note was copied from a baby's chart. Lactation Consultation Note  Patient Name: Deanna Russell CVELF'Y Date: 08/18/2020 Reason for consult: Follow-up assessment Baby 33hrs old, mom resting in bed, baby asleep skin to skin with dad. Mom reports breastfeeding not going so well, states baby with difficulty opening mouth wide to latch to the breast, gets fussy when at breast, nipple shield falls off, unsure if with breast milk d/t none expressing with breast pump. States last feeding was ~7p, baby took 75ml formula, has pumped ~3 times today. Mom would like to exclusively breastfeed.  Discussed use of 64french, mom agreeable. Fitted mom for 80mm nipple shield, taught how to apply, mom correctly demonstrated. Baby with difficulty latching in cross cradle, mom agreed to switch to football. Baby with strong tugs and wide angle on nipple shield, took 47ml formula in ~46mins, mom reports baby with sucks after formula then fell asleep. Advised to pump after feeding. Discussed cleaning of 20french, feed on cue, wake if >3hrs since last feeding, purpose of DEBP, pump frequency, engorgement, skin to skin, cluster feeding, signs of milk transfer. Advised 43french and nipple shield are temporary, encouraged mom to f/u with LC at peds office upon discharge. BF Supplementation sheet given. Mom voiced understanding and with no further concerns. Left the room with mom holding the baby skin to skin. BGilliam, RN, IBCLC  Plan - feed on cue, wake if >3hrs since last feeding - use 74mm nipple shield and 13french with each feeding (will re-evaluate in the morning) - pump after each feeding - call for LC/RN support if with difficulty latching or feeding concerns  Maternal Data    Feeding Feeding Type: Breast Fed Nipple Type: Slow - flow  LATCH Score Latch: Grasps breast easily, tongue down, lips flanged, rhythmical sucking.  Audible Swallowing: Spontaneous and intermittent  Type of Nipple: Flat  Comfort  (Breast/Nipple): Soft / non-tender  Hold (Positioning): Assistance needed to correctly position infant at breast and maintain latch.  LATCH Score: 8  Interventions Interventions: Breast feeding basics reviewed;Assisted with latch;Skin to skin;Breast compression;Adjust position;Support pillows;Position options;DEBP  Lactation Tools Discussed/Used Tools: 103F feeding tube / Syringe Nipple shield size: 20   Consult Status Consult Status: Follow-up Date: 08/19/20 Follow-up type: In-patient    Deanna Russell 08/18/2020, 10:58 PM

## 2020-08-18 NOTE — Progress Notes (Signed)
Postpartum Progress Note  Post Partum Day 1 s/p spontaneous vaginal delivery.  Patient reports well-controlled pain, ambulating without difficulty, voiding spontaneously, tolerating PO.  Vaginal bleeding is appropriate.   Objective: Blood pressure 103/66, pulse 89, temperature 97.9 F (36.6 C), temperature source Oral, resp. rate 18, height 5\' 10"  (1.778 m), weight 121.1 kg, last menstrual period 11/09/2019, SpO2 100 %, unknown if currently breastfeeding.  Physical Exam:  General: alert and no distress Lochia: appropriate Uterine Fundus: firm DVT Evaluation: No evidence of DVT seen on physical exam.  Recent Labs    08/17/20 0752 08/18/20 0604  HGB 11.8* 10.0*  HCT 36.2 30.7*    Assessment/Plan: . Postpartum Day 1, s/p vaginal delivery. . Continue routine postpartum care . Lactation following . Pt desires circ, nursery states baby needs feeding to improve . Anticipate discharge home today or tomorrow pending circ status.   LOS: 1 day   14/02/21 08/18/2020, 7:30 AM

## 2020-08-18 NOTE — Lactation Note (Signed)
This note was copied from a baby's chart. Lactation Consultation Note Baby 15 hrs old. RN call for [facility] d/t baby not feeding and baby crying. Mom has flat compressible nipples. Nipples evert w/stimulation.  Not able to maintain latch. Mom has NS. #24 NS to large for baby's mouth. Applied #20 NS. Mom's breast is soft, needs a lot of breast support and firming for baby to latch. Baby wouldn't suck. LC inserted Similac 20 cal. Into NS w/curve tip syring. LC had to insert some formula int baby's cheeks to get him to suckle. Baby lost some formula. LC estimate baby took approximately 5 ml. Baby jittery. Reported to RN. Parents stated they noticed baby getting jittery after 0300. Parents has been doing STS most of night. RN calling MD. Encouraged mom to pump for stimulation.   Patient Name: Deanna Russell Date: 08/18/2020 Reason for consult: Follow-up assessment;Primapara;Term   Maternal Data Does the patient have breastfeeding experience prior to this delivery?: No  Feeding Feeding Type: Formula  LATCH Score Latch: Repeated attempts needed to sustain latch, nipple held in mouth throughout feeding, stimulation needed to elicit sucking reflex.  Audible Swallowing: None  Type of Nipple: Flat  Comfort (Breast/Nipple): Soft / non-tender  Hold (Positioning): Full assist, staff holds infant at breast  LATCH Score: 4  Interventions Interventions: Breast feeding basics reviewed;Adjust position;DEBP;Assisted with latch;Support pillows;Skin to skin;Position options;Breast massage;Hand express;Pre-pump if needed;Reverse pressure;Breast compression  Lactation Tools Discussed/Used Tools: Pump Nipple shield size: 20 Shell Type: Inverted Breast pump type: Double-Electric Breast Pump WIC Program: No   Consult Status Consult Status: Follow-up Date: 08/18/20 Follow-up type: In-patient    Charyl Dancer 08/18/2020, 5:27 AM

## 2020-08-18 NOTE — Social Work (Signed)
MOB was referred for history of depression and anxiety.   * Referral screened out by Clinical Social Worker because none of the following criteria appear to apply:  ~ History of anxiety/depression during this pregnancy, or of post-partum depression following prior delivery. ~ Diagnosis of anxiety and/or depression within last 3 years. OR * MOB's symptoms currently being treated with medication and/or therapy. Per chart review, MOB currently has active prescription for Zoloft.   Please contact the Clinical Social Worker if needs arise, by [facility] request, or if MOB scores greater than 9/yes to question 10 on Edinburgh Postpartum Depression Screen.  Manfred Arch, LCSWA Clinical Social Work [employer] and [employer]  [phone]

## 2020-08-18 NOTE — Anesthesia Postprocedure Evaluation (Signed)
Anesthesia Post Note  Patient: Sports coach  Procedure(s) Performed: AN AD HOC LABOR EPIDURAL     Patient location during evaluation: Mother Baby Anesthesia Type: Epidural Level of consciousness: awake Pain management: satisfactory to patient Vital Signs Assessment: post-procedure vital signs reviewed and stable Respiratory status: spontaneous breathing Cardiovascular status: stable Anesthetic complications: no   No complications documented.  Last Vitals:  Vitals:   08/18/20 0100 08/18/20 0514  BP: (!) 92/53 103/66  Pulse: 79 89  Resp: 18 18  Temp: 36.5 C 36.6 C  SpO2: 100% 100%    Last Pain:  Vitals:   08/18/20 0829  TempSrc:   PainSc: 0-No pain   Pain Goal:                   [employer]

## 2020-08-19 NOTE — Lactation Note (Signed)
This note was copied from a baby's chart. Lactation Consultation Note  Patient Name: Boy Evian Derringer MBWGY'K Date: 08/19/2020 Reason for consult: Follow-up assessment;Other (Comment) (LC was informed mom / baby being D/C today)   Maternal Data Has patient been taught Hand Expression?: Yes (mom spoke about being able to hand express)  Feeding Feeding Type: Bottle Fed - Formula  LATCH Score                   Interventions Interventions: Breast feeding basics reviewed  Lactation Tools Discussed/Used Tools: Shells;Pump;Flanges Nipple shield size: 20;24 (per mom has both and using the #20 NS) Shell Type: Inverted Breast pump type: Manual;Double-Electric Breast Pump Pump Review: Milk Storage   Consult Status Consult Status: Complete Date: 08/19/20 Follow-up type: In-patient    Matilde Sprang Rodger Giangregorio 08/19/2020, 3:36 PM

## 2020-08-19 NOTE — Discharge Summary (Signed)
Postpartum Discharge Summary      Patient Name: Deanna Russell DOB: Dec 18, 1995 MRN: [id number]  Date of admission: 08/17/2020 Delivery date:08/17/2020  Delivering provider: Everlene Farrier  Date of discharge: 08/19/2020  Admitting diagnosis: Post-dates pregnancy [O48.0] Intrauterine pregnancy: [redacted]w[redacted]d    Secondary diagnosis:  Active Problems:   Post-dates pregnancy  Additional problems:      Discharge diagnosis: Term Pregnancy Delivered                                              Post partum procedures:  Augmentation: AROM, Pitocin and Cytotec Complications: None  Hospital course: Induction of Labor With Vaginal Delivery   24y.o. yo G1P1001 at 446w2das admitted to the hospital 08/17/2020 for induction of labor.  Indication for induction: Postdates.  Patient had an uncomplicated labor course as follows: Membrane Rupture Time/Date: 9:12 AM ,08/17/2020   Delivery Method:Vaginal, Spontaneous  Episiotomy: None  Lacerations:  2nd degree;Periurethral  Details of delivery can be found in separate delivery note.  Patient had a routine postpartum course. Patient is discharged home 08/19/20.  Newborn Data: Birth date:08/17/2020  Birth time:1:47 PM  Gender:Female  Living status:Living  Apgars:9 ,9  Weight:3255 g   Magnesium Sulfate received: No BMZ received: No Rhophylac:N/A MMR:N/A T-DaP:Given prenatally Flu: Yes Transfusion:No  Physical exam  Vitals:   08/18/20 0514 08/18/20 1606 08/18/20 2130 08/19/20 0510  BP: 103/66 112/68 105/60 106/72  Pulse: 89 98 78 88  Resp: '18 18 18 18  ' Temp: 97.9 F (36.6 C) 98 F (36.7 C) 98 F (36.7 C) 97.7 F (36.5 C)  TempSrc: Oral Oral Oral Oral  SpO2: 100%  98% 99%  Weight:      Height:       General: alert, cooperative and no distress Lochia: appropriate Uterine Fundus: firm Incision: N/A DVT Evaluation: No evidence of DVT seen on physical exam. Labs: Lab Results  Component Value Date   WBC 12.3 (H) 08/18/2020   HGB 10.0  (L) 08/18/2020   HCT 30.7 (L) 08/18/2020   MCV 89.5 08/18/2020   PLT 228 08/18/2020   No flowsheet data found. Edinburgh Score: Edinburgh Postnatal Depression Scale Screening Tool 08/18/2020  I have been able to laugh and see the funny side of things. 0  I have looked forward with enjoyment to things. 0  I have blamed myself unnecessarily when things went wrong. 0  I have been anxious or worried for no good reason. 0  I have felt scared or panicky for no good reason. 0  Things have been getting on top of me. 1  I have been so unhappy that I have had difficulty sleeping. 0  I have felt sad or miserable. 0  I have been so unhappy that I have been crying. 0  The thought of harming myself has occurred to me. 0  Edinburgh Postnatal Depression Scale Total 1      After visit meds:  Allergies as of 08/19/2020   No Known Allergies     Medication List    TAKE these medications   doxylamine (Sleep) 25 MG tablet Commonly known as: UNISOM Take 25 mg by mouth at bedtime as needed.   famotidine 20 MG tablet Commonly known as: Pepcid Take 1 tablet (20 mg total) by mouth 2 (two) times daily.   ondansetron 4 MG disintegrating tablet Commonly known as:  Zofran ODT Take 1 tablet (4 mg total) by mouth every 6 (six) hours as needed for nausea.   PRENATAL VITAMIN PO Take by mouth.   SERTRALINE HCL PO Take by mouth.        Discharge home in stable condition Infant Feeding: Breast Infant Disposition:home with mother Discharge instruction: per After Visit Summary and Postpartum booklet. Activity: Advance as tolerated. Pelvic rest for 6 weeks.  Diet: routine diet Anticipated Birth Control: Unsure Postpartum Appointment:6 weeks Additional Postpartum F/U:   Future Appointments:No future appointments. Follow up Visit:      08/19/2020 Luz Lex, MD

## 2020-08-19 NOTE — Lactation Note (Signed)
This note was copied from a baby's chart. Lactation Consultation Note  Patient Name: Deanna Russell OZDGU'Y Date: 08/19/2020 Reason for consult: Follow-up assessment;Difficult latch;Term;Infant weight loss;Other (Comment) (6 % weight loss , elevated Serum  Bilirubin)  Baby is 47 hours old  And per mom and dad recently fed with a #20 NS and is now sleeping.  Mom mentioned due to the bili being elevated will probably will recheck this afternoon.  Mom and dad shared what breast feeding tools turned the feedings around , the NS , instilling and appetizer in the top and inserting the 5 F SNS to supplement on the side of the NS after the baby is latched . Mom has been pumping and the most she had pumped is 6 ml.  LC praised mom and dad for having a good handle on the feeding plan.  LC reviewed sore nipple and engorgement prevention and tx .    LC plan : 8-12 feedings in 24 hours  Prior to applying the NS hand express, and use the hand pump to pre-pump to Stretch the nipple / areola complex due to edema and apply NS with appetizer ( EBM or formula)  Latch with firm support - and after the baby is latched add the 5 F SNS for supplementing due to increased bilirubin.  After feeding post pump both breast for 10 -15 mins , save milk for the next feedings.   Plan B recommendation if needed - feed at the breast 15 -20 mins with the NS and supplement with EBM or formula with newborn DR. Brown nipple pace feeding. ( LC explained PACE feeding to mom and dad )   Mom aware her Pedis office has a LC and also has the [employer] [facility] [place] resource.     Maternal Data Has patient been taught Hand Expression?: Yes (mom spoke about being able to hand express)  Feeding Feeding Type:  (per mom and dad baby recently has fed)  LATCH Score                   Interventions Interventions: Breast feeding basics reviewed  Lactation Tools Discussed/Used Tools: Shells;Pump;Flanges Nipple shield size:  20;24 (per mom has both and using the #20 NS) Shell Type: Inverted Breast pump type: Manual;Double-Electric Breast Pump Pump Review: Milk Storage   Consult Status Consult Status: Follow-up Date: 08/19/20 Follow-up type: In-patient    Matilde Sprang Shuna Tabor 08/19/2020, 12:48 PM

## 2021-03-16 ENCOUNTER — Emergency Department (HOSPITAL_BASED_OUTPATIENT_CLINIC_OR_DEPARTMENT_OTHER)
Admission: EM | Admit: 2021-03-16 | Discharge: 2021-03-17 | Disposition: A | Discharge status: Home / Self Care | Payer: 59 | Attending: Emergency Medicine | Admitting: Emergency Medicine

## 2021-03-16 ENCOUNTER — Ambulatory Visit
Admission: EM | Admit: 2021-03-16 | Discharge: 2021-03-16 | Disposition: A | Discharge status: Nursing Home (Includes ICF) | Payer: 59

## 2021-03-16 ENCOUNTER — Other Ambulatory Visit: Payer: Self-pay

## 2021-03-16 ENCOUNTER — Encounter: Payer: Self-pay | Admitting: *Deleted

## 2021-03-16 ENCOUNTER — Encounter (HOSPITAL_BASED_OUTPATIENT_CLINIC_OR_DEPARTMENT_OTHER): Payer: Self-pay | Admitting: Emergency Medicine

## 2021-03-16 ENCOUNTER — Emergency Department (HOSPITAL_BASED_OUTPATIENT_CLINIC_OR_DEPARTMENT_OTHER): Payer: 59

## 2021-03-16 DIAGNOSIS — K8050 Calculus of bile duct without cholangitis or cholecystitis without obstruction: Secondary | ICD-10-CM | POA: Diagnosis not present

## 2021-03-16 DIAGNOSIS — K8021 Calculus of gallbladder without cholecystitis with obstruction: Secondary | ICD-10-CM | POA: Diagnosis not present

## 2021-03-16 DIAGNOSIS — R1011 Right upper quadrant pain: Secondary | ICD-10-CM | POA: Diagnosis not present

## 2021-03-16 LAB — COMPREHENSIVE METABOLIC PANEL
ALT: 273 U/L — ABNORMAL HIGH (ref 0–44)
AST: 34 U/L (ref 15–41)
Albumin: 4.3 g/dL (ref 3.5–5.0)
Alkaline Phosphatase: 222 U/L — ABNORMAL HIGH (ref 38–126)
Anion gap: 10 (ref 5–15)
BUN: 9 mg/dL (ref 6–20)
CO2: 26 mmol/L (ref 22–32)
Calcium: 9.9 mg/dL (ref 8.9–10.3)
Chloride: 105 mmol/L (ref 98–111)
Creatinine, Ser: 0.71 mg/dL (ref 0.44–1.00)
GFR, Estimated: >60 mL/min (ref >60)
Glucose, Bld: 101 mg/dL — ABNORMAL HIGH (ref 70–99)
Potassium: 3.8 mmol/L (ref 3.5–5.1)
Sodium: 141 mmol/L (ref 135–145)
Total Bilirubin: 3.3 mg/dL — ABNORMAL HIGH (ref 0.3–1.2)
Total Protein: 7.8 g/dL (ref 6.5–8.1)

## 2021-03-16 LAB — URINALYSIS, ROUTINE W REFLEX MICROSCOPIC
Glucose, UA: NEGATIVE mg/dL
Ketones, ur: NEGATIVE mg/dL
Nitrite: NEGATIVE
Protein, ur: NEGATIVE mg/dL
Specific Gravity, Urine: 1.02 (ref 1.005–1.030)
pH: 5.5 (ref 5.0–8.0)

## 2021-03-16 LAB — CBC
HCT: 39.6 % (ref 36.0–46.0)
Hemoglobin: 12.9 g/dL (ref 12.0–15.0)
MCH: 28.4 pg (ref 26.0–34.0)
MCHC: 32.6 g/dL (ref 30.0–36.0)
MCV: 87.2 fL (ref 80.0–100.0)
Platelets: 273 10*3/uL (ref 150–400)
RBC: 4.54 MIL/uL (ref 3.87–5.11)
RDW: 13.8 % (ref 11.5–15.5)
WBC: 6.5 10*3/uL (ref 4.0–10.5)
nRBC: 0 % (ref 0.0–0.2)

## 2021-03-16 LAB — LIPASE, BLOOD: Lipase: 20 U/L (ref 11–51)

## 2021-03-16 LAB — POCT URINALYSIS DIP (MANUAL ENTRY)
Glucose, UA: 100 mg/dL — AB
Ketones, POC UA: NEGATIVE mg/dL
Leukocytes, UA: NEGATIVE
Nitrite, UA: NEGATIVE
Protein Ur, POC: NEGATIVE mg/dL
Spec Grav, UA: >=1.03 — AB (ref 1.010–1.025)
Urobilinogen, UA: 1 E.U./dL
pH, UA: 5.5 (ref 5.0–8.0)

## 2021-03-16 LAB — PREGNANCY, URINE: Preg Test, Ur: NEGATIVE

## 2021-03-16 LAB — POCT URINE PREGNANCY: Preg Test, Ur: NEGATIVE

## 2021-03-16 NOTE — ED Provider Notes (Addendum)
[facility]    CSN: [id number] Arrival date & time: 03/16/21  1915      History   Chief Complaint Chief Complaint  Patient presents with   Abdominal Pain   Back Pain    HPI Deanna Russell is a 25 y.o. female presenting today for evaluation of abdominal pain and back pain.  Patient is approximately 6 months postpartum and reports that she has had intermittent back pain since then.  Reports that recently back pain has slightly worsened on the right side as well as noticed some abdominal pain in her upper abdomen.  She has had some nausea.  Pain worsens after eating.  She also notes that her urine has become darker.  Reports episode of gray stool today.  Denies prior abdominal surgeries.  HPI  Past Medical History:  Diagnosis Date   Anxiety     Patient Active Problem List   Diagnosis Date Noted   Post-dates pregnancy 08/17/2020    Past Surgical History:  Procedure Laterality Date   WISDOM TOOTH EXTRACTION      OB History     Gravida  1   Para  1   Term  1   Preterm      AB      Living  1      SAB      IAB      Ectopic      Multiple  0   Live Births  1            Home Medications    Prior to Admission medications   Medication Sig Start Date End Date Taking? Authorizing Provider  ibuprofen (ADVIL) 600 MG tablet Take 600 mg by mouth 2 (two) times daily as needed.   Yes [provider]  Prenatal Vit-Fe Fumarate-FA (PRENATAL VITAMIN PO) Take by mouth.   Yes [provider]  SERTRALINE HCL PO Take by mouth.   Yes [provider]  VITAMIN D PO Take by mouth.   Yes [provider]  doxylamine, Sleep, (UNISOM) 25 MG tablet Take 25 mg by mouth at bedtime as needed.    [provider]  famotidine (PEPCID) 20 MG tablet Take 1 tablet (20 mg total) by mouth 2 (two) times daily. 06/01/20   Aviva Signs, CNM  ondansetron (ZOFRAN ODT) 8 MG disintegrating tablet Take 1 tablet (8 mg total) by  mouth every 8 (eight) hours as needed for nausea or vomiting. 03/17/21   Molpus, John, MD    Family History Family History  Problem Relation Age of Onset   Healthy Mother    Healthy Father     Social History Social History   Tobacco Use   Smoking status: Never   Smokeless tobacco: Never  Vaping Use   Vaping Use: Never used  Substance Use Topics   Alcohol use: Never    Comment: occasional   Drug use: Never     Allergies   Patient has no known allergies.   Review of Systems Review of Systems  Constitutional:  Negative for fever.  Respiratory:  Negative for shortness of breath.   Cardiovascular:  Negative for chest pain.  Gastrointestinal:  Positive for abdominal pain. Negative for diarrhea, nausea and vomiting.  Genitourinary:  Negative for dysuria, flank pain, genital sores, hematuria, menstrual problem, vaginal bleeding, vaginal discharge and vaginal pain.  Musculoskeletal:  Positive for back pain and myalgias.  Skin:  Negative for rash.  Neurological:  Negative for dizziness, light-headedness and headaches.  Physical Exam Triage Vital Signs ED Triage Vitals  Enc Vitals Group     BP      Pulse      Resp      Temp      Temp src      SpO2      Weight      Height      Head Circumference      Peak Flow      Pain Score      Pain Loc      Pain Edu?      Excl. in GC?    No data found.  Updated Vital Signs BP 121/73   Pulse 80   Temp 97.6 F (36.4 C) (Temporal)   Resp 14   SpO2 96%   Breastfeeding Yes   Visual Acuity Right Eye Distance:   Left Eye Distance:   Bilateral Distance:    Right Eye Near:   Left Eye Near:    Bilateral Near:     Physical Exam Vitals and nursing note reviewed.  Constitutional:      Appearance: She is well-developed.     Comments: No acute distress  HENT:     Head: Normocephalic and atraumatic.     Nose: Nose normal.  Eyes:     General: Scleral icterus present.     Comments: Mild yellowing of eyes   Cardiovascular:     Rate and Rhythm: Normal rate and regular rhythm.  Pulmonary:     Effort: Pulmonary effort is normal. No respiratory distress.  Abdominal:     General: There is no distension.     Comments: Soft, nondistended, tenderness to palpation to bilateral upper quadrants and at the gastrium, more focal in right upper quadrant with slightly positive Murphy's  Musculoskeletal:        General: Normal range of motion.     Cervical back: Neck supple.  Skin:    General: Skin is warm and dry.  Neurological:     Mental Status: She is alert and oriented to person, place, and time.     UC Treatments / Results  Labs (all labs ordered are listed, but only abnormal results are displayed) Labs Reviewed  POCT URINALYSIS DIP (MANUAL ENTRY) - Abnormal; Notable for the following components:      Result Value   Color, UA other (*)    Glucose, UA =100 (*)    Bilirubin, UA large (*)    Spec Grav, UA >=1.030 (*)    Blood, UA small (*)    All other components within normal limits  POCT URINE PREGNANCY    EKG   Radiology US Abdomen Limited RUQ (LIVER/GB)  Result Date: 03/16/2021 CLINICAL DATA:  Right upper quadrant pain EXAM: ULTRASOUND ABDOMEN LIMITED RIGHT UPPER QUADRANT COMPARISON:  None. FINDINGS: Gallbladder: Gallbladder sludge with small stones. A negative sonographic Eulah Pont sign was reported by the sonographer. No pericholecystic fluid or gallbladder wall thickening. Common bile duct: Diameter: 6 mm Liver: No focal lesion identified. Within normal limits in parenchymal echogenicity. Portal vein is patent on color Doppler imaging with normal direction of blood flow towards the liver. Other: None. IMPRESSION: Cholelithiasis without other evidence of acute cholecystitis. Electronically Signed   By: Deatra Robinson M.D.   On: 03/16/2021 23:42    Procedures Procedures (including critical care time)  Medications Ordered in UC Medications - No data to display  Initial Impression /  Assessment and Plan / UC Course  I have reviewed the triage vital  signs and the nursing notes.  Pertinent labs & imaging results that were available during my care of the patient were reviewed by me and considered in my medical decision making (see chart for details).     History and exam most concerning for possible underlying gallbladder pathology, given bilirubin in urine, yellowing of the eyes and symptoms recommending further evaluation and work-up in emergency room. Rule out possible developing cholecystitis. Patient verbalized understanding.  Stable on discharge and went via private vehicle.    Discussed strict return precautions. Patient verbalized understanding and is agreeable with plan.  Final Clinical Impressions(s) / UC Diagnoses   Final diagnoses:  RUQ pain   Discharge Instructions   None    ED Prescriptions   None    PDMP not reviewed this encounter.   Sharyon Cable [place] C, PA-C 03/17/21 1343    Patterson Hamm[place]mith C, PA-C 03/17/21 1344

## 2021-03-16 NOTE — ED Triage Notes (Signed)
Pt via pov from uc with RUQ pain; states she was told her bilirubin was high, she has grayish stool, sent here for gallbladder workup. Pt alert & oriented, nad noted.

## 2021-03-16 NOTE — ED Provider Notes (Signed)
[facility] Provider Note: Deanna Dell, MD, FACEP  CSN: [id number] MRN: [id number] ARRIVAL: 03/16/21 at 2132 ROOM: DB011/DB011   CHIEF COMPLAINT  Abdominal Pain   HISTORY OF PRESENT ILLNESS  03/16/21 10:55 PM Deanna Russell is a 25 y.o. female with about a week of right upper quadrant pain.  The pain has been waxing and waning.  It is worse after eating.  It is not severe right now and she rates it currently about a 3 out of 10 but it has been much more severe at times.  She has sometimes had associated nausea and vomiting.  She has also noticed that her stools are gray.  She was seen in urgent care earlier this evening and was sent here for evaluation of her gallbladder.  She is also having pain in her right lower posterior ribs which currently is more painful than her right upper quadrant.   Past Medical History:  Diagnosis Date   Anxiety     Past Surgical History:  Procedure Laterality Date   WISDOM TOOTH EXTRACTION      Family History  Problem Relation Age of Onset   Healthy Mother    Healthy Father     Social History   Tobacco Use   Smoking status: Never   Smokeless tobacco: Never  Vaping Use   Vaping Use: Never used  Substance Use Topics   Alcohol use: Never    Comment: occasional   Drug use: Never    Prior to Admission medications   Medication Sig Start Date End Date Taking? Authorizing Provider  ondansetron (ZOFRAN ODT) 8 MG disintegrating tablet Take 1 tablet (8 mg total) by mouth every 8 (eight) hours as needed for nausea or vomiting. 03/17/21  Yes Tennis Mckinnon, MD  doxylamine, Sleep, (UNISOM) 25 MG tablet Take 25 mg by mouth at bedtime as needed.    [provider]  famotidine (PEPCID) 20 MG tablet Take 1 tablet (20 mg total) by mouth 2 (two) times daily. 06/01/20   Aviva Signs, CNM  ibuprofen (ADVIL) 600 MG tablet Take 600 mg by mouth 2 (two) times daily as needed.    [provider]  Prenatal Vit-Fe Fumarate-FA  (PRENATAL VITAMIN PO) Take by mouth.    [provider]  SERTRALINE HCL PO Take by mouth.    [provider]  VITAMIN D PO Take by mouth.    [provider]    Allergies Patient has no known allergies.   REVIEW OF SYSTEMS  Negative except as noted here or in the History of Present Illness.   PHYSICAL EXAMINATION  Initial Vital Signs Blood pressure 104/69, pulse 79, temperature 98.4 F (36.9 C), temperature source Oral, resp. rate 19, height 5\' 10"  (1.778 m), weight 124.7 kg, SpO2 99 %, currently breastfeeding.  Examination General: Well-developed, well-nourished female in no acute distress; appearance consistent with age of record HENT: normocephalic; atraumatic; no scleral icterus Eyes: pupils equal, round and reactive to light; extraocular muscles intact Neck: supple Heart: regular rate and rhythm Lungs: clear to auscultation bilaterally Chest: Right lower posterior rib tenderness Abdomen: soft; nondistended; mild right upper quadrant tenderness without definite Murphy sign; bowel sounds present Extremities: No deformity; full range of motion; pulses normal Neurologic: Awake, alert and oriented; motor function intact in all extremities and symmetric; no facial droop Skin: Warm and dry; no jaundice Psychiatric: Normal mood and affect   RESULTS  Summary of this visit's results, reviewed and interpreted by myself:   EKG Interpretation  Date/Time:    Ventricular Rate:    PR Interval:    QRS Duration:   QT Interval:    QTC Calculation:   R Axis:     Text Interpretation:          Laboratory Studies: Results for orders placed or performed during the hospital encounter of 03/16/21 (from the past 24 hour(s))  Lipase, blood     Status: None   Collection Time: 03/16/21 10:16 PM  Result Value Ref Range   Lipase 20 11 - 51 U/L  Comprehensive metabolic panel     Status: Abnormal   Collection Time: 03/16/21 10:16 PM  Result Value Ref Range    Sodium 141 135 - 145 mmol/L   Potassium 3.8 3.5 - 5.1 mmol/L   Chloride 105 98 - 111 mmol/L   CO2 26 22 - 32 mmol/L   Glucose, Bld 101 (H) 70 - 99 mg/dL   BUN 9 6 - 20 mg/dL   Creatinine, Ser 2.59 0.44 - 1.00 mg/dL   Calcium 9.9 8.9 - 56.3 mg/dL   Total Protein 7.8 6.5 - 8.1 g/dL   Albumin 4.3 3.5 - 5.0 g/dL   AST 34 15 - 41 U/L   ALT 273 (H) 0 - 44 U/L   Alkaline Phosphatase 222 (H) 38 - 126 U/L   Total Bilirubin 3.3 (H) 0.3 - 1.2 mg/dL   GFR, Estimated >87 >56 mL/min   Anion gap 10 5 - 15  CBC     Status: None   Collection Time: 03/16/21 10:16 PM  Result Value Ref Range   WBC 6.5 4.0 - 10.5 K/uL   RBC 4.54 3.87 - 5.11 MIL/uL   Hemoglobin 12.9 12.0 - 15.0 g/dL   HCT 43.3 29.5 - 18.8 %   MCV 87.2 80.0 - 100.0 fL   MCH 28.4 26.0 - 34.0 pg   MCHC 32.6 30.0 - 36.0 g/dL   RDW 41.6 60.6 - 30.1 %   Platelets 273 150 - 400 K/uL   nRBC 0.0 0.0 - 0.2 %  Urinalysis, Routine w reflex microscopic     Status: Abnormal   Collection Time: 03/16/21 10:16 PM  Result Value Ref Range   Color, Urine YELLOW YELLOW   APPearance CLEAR CLEAR   Specific Gravity, Urine 1.020 1.005 - 1.030   pH 5.5 5.0 - 8.0   Glucose, UA NEGATIVE NEGATIVE mg/dL   Hgb urine dipstick SMALL (A) NEGATIVE   Bilirubin Urine MODERATE (A) NEGATIVE   Ketones, ur NEGATIVE NEGATIVE mg/dL   Protein, ur NEGATIVE NEGATIVE mg/dL   Nitrite NEGATIVE NEGATIVE   Leukocytes,Ua SMALL (A) NEGATIVE   RBC / HPF 0-5 0 - 5 RBC/hpf   WBC, UA 0-5 0 - 5 WBC/hpf   Bacteria, UA FEW (A) NONE SEEN   Squamous Epithelial / LPF 0-5 0 - 5   Mucus PRESENT   Pregnancy, urine     Status: None   Collection Time: 03/16/21 10:16 PM  Result Value Ref Range   Preg Test, Ur NEGATIVE NEGATIVE   Imaging Studies: US Abdomen Limited RUQ (LIVER/GB)  Result Date: 03/16/2021 CLINICAL DATA:  Right upper quadrant pain EXAM: ULTRASOUND ABDOMEN LIMITED RIGHT UPPER QUADRANT COMPARISON:  None. FINDINGS: Gallbladder: Gallbladder sludge with small stones. A  negative sonographic Eulah Pont sign was reported by the sonographer. No pericholecystic fluid or gallbladder wall thickening. Common bile duct: Diameter: 6 mm Liver: No focal lesion identified. Within normal limits in parenchymal echogenicity. Portal vein is patent on color Doppler imaging with  normal direction of blood flow towards the liver. Other: None. IMPRESSION: Cholelithiasis without other evidence of acute cholecystitis. Electronically Signed   By: Deatra Robinson M.D.   On: 03/16/2021 23:42    ED COURSE and MDM  Nursing notes, initial and subsequent vitals signs, including pulse oximetry, reviewed and interpreted by myself.  Vitals:   03/16/21 2143 03/16/21 2144 03/16/21 2245 03/16/21 2315  BP: 130/78  104/69 119/71  Pulse: 97  79 83  Resp: 19   18  Temp: 98.4 F (36.9 C)     TempSrc: Oral     SpO2: 98%  99% 100%  Weight:  124.7 kg    Height:  5\' 10"  (1.778 m)     Medications - No data to display  Patient advised of ultrasound findings consistent with cholelithiasis.  Her right upper quadrant pain is almost certainly due to biliary colic.  The patient does not wish any narcotics but would like a refill of Zofran.  She was advised that given the acuity of symptoms she has had recently she may wish to proceed sooner rather than later with elective cholecystectomy.  We will refer to [facility].  PROCEDURES  Procedures   ED DIAGNOSES     ICD-10-CM   1. Biliary colic  K80.50     2. RUQ abdominal pain  R10.11 [facility] Abdomen Limited RUQ (LIVER/GB)    US Abdomen Limited RUQ (LIVER/GB)    3. Calculus of gallbladder with biliary obstruction but without cholecystitis  K80.21          Chun Sellen, MD 03/17/21 0004

## 2021-03-16 NOTE — ED Triage Notes (Signed)
C/O pain wrapping around mid-back and upper abd pain; states pain constant, but worse after eating and drinking.   C/O dark urine and noticing her breastmilk supply has decreased significantly.  Today had "gray" stool.  C/O poor appetite, nausea.

## 2021-03-16 NOTE — ED Notes (Signed)
Patient is being discharged from the Urgent Care Center and sent to the Emergency Department via private vehicle. Per H. Wieters, PA, patient is stable but in need of higher level of care due to concern for acute liver and/or gallbladder issue. Patient is aware and verbalizes understanding of plan of care.  Vitals:   03/16/21 2001  BP: 121/73  Pulse: 80  Resp: 14  Temp: 97.6 F (36.4 C)  SpO2: 96%

## 2021-03-17 MED ORDER — ONDANSETRON 8 MG PO TBDP: 8.0000 mg | ORAL_TABLET | Freq: Three times a day (TID) | ORAL | 0 refills | Status: AC | PRN

## 2021-06-05 IMAGING — US US MFM OB DETAIL+14 WK
1 series · 13 of 28 positions shown · non-contrast
Comparison: none

[Series 1: us mfm ob detail+14 wk · 13 of 122 slices shown]
[im 5/122]
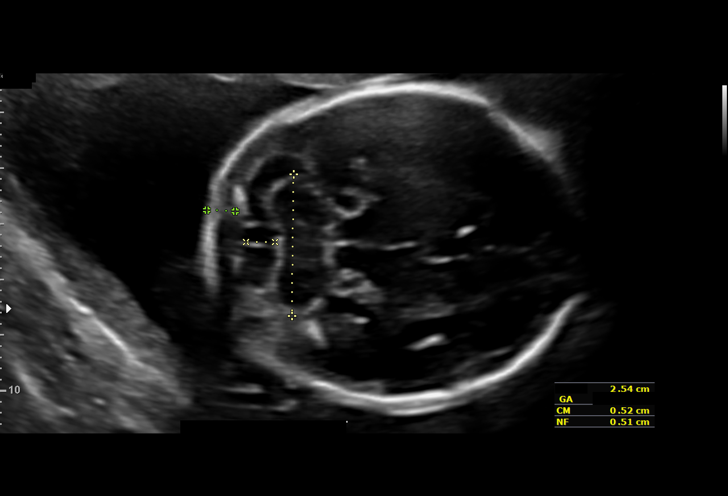
[im 14/122]
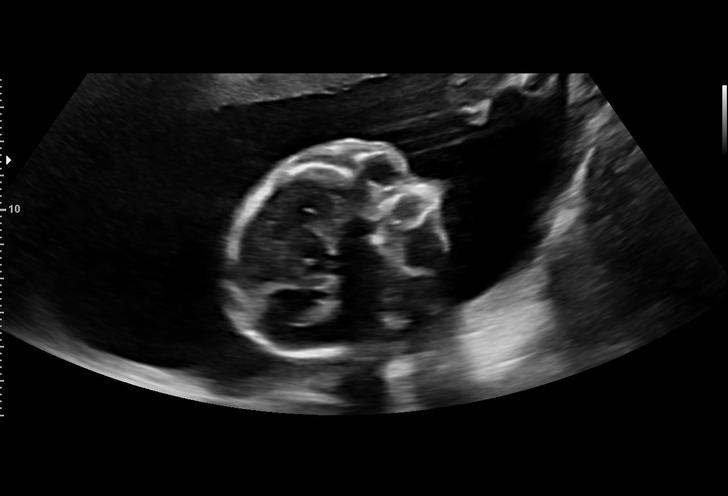
[im 23/122]
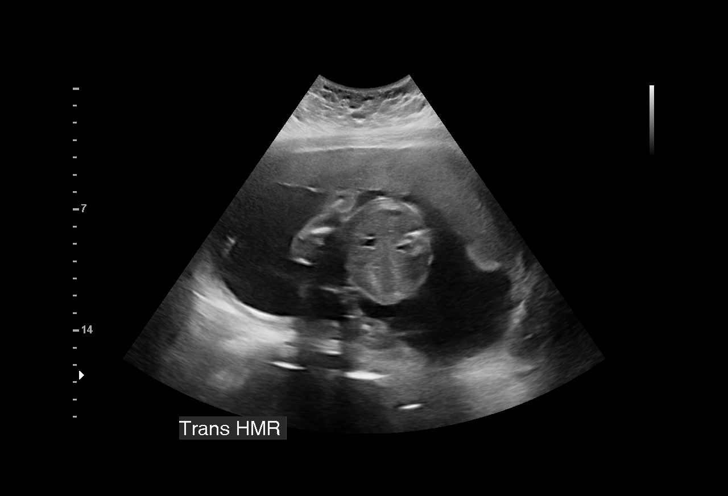
[im 32/122]
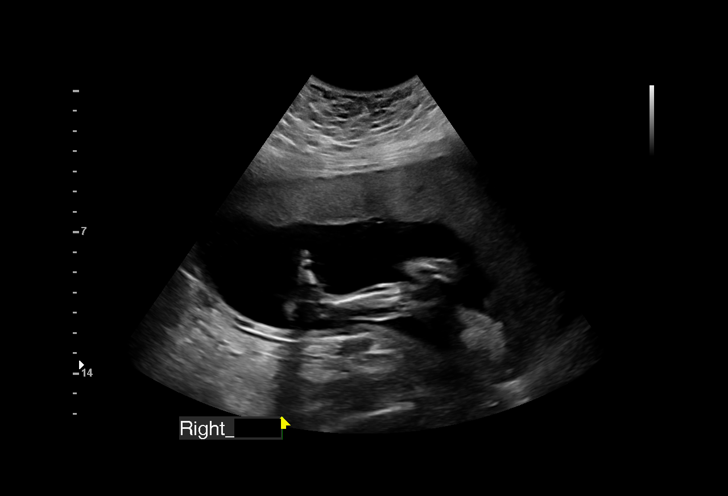
[im 41/122]
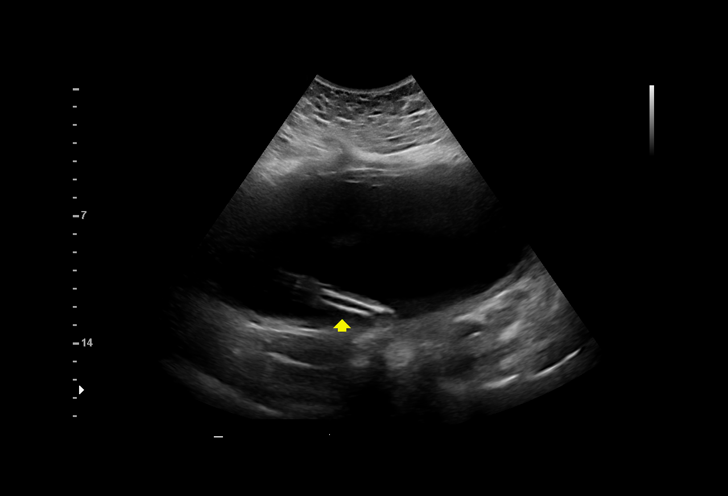
[im 50/122]
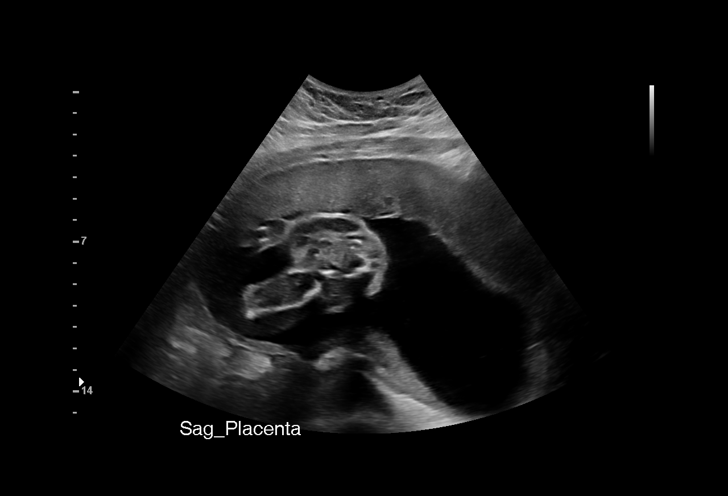
[im 63/122]
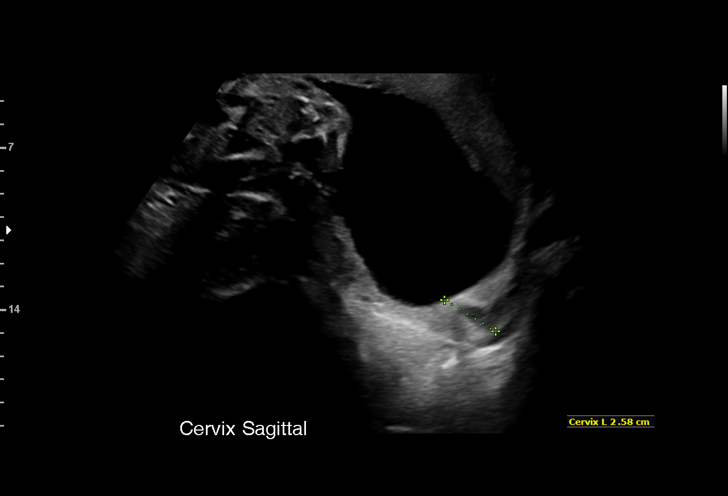
[im 72/122]
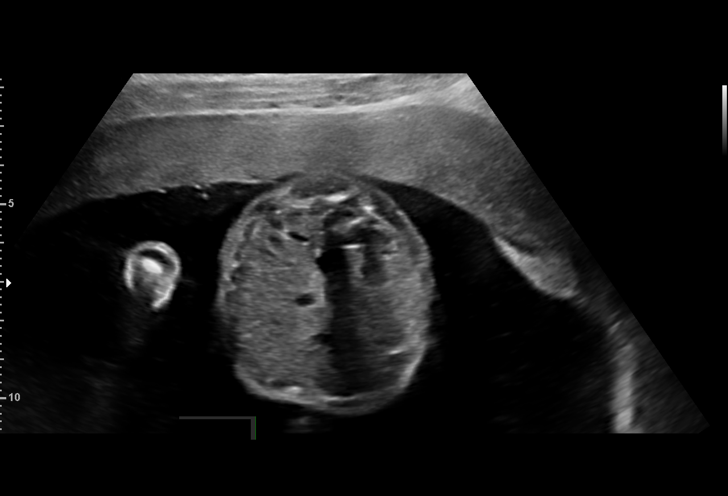
[im 81/122]
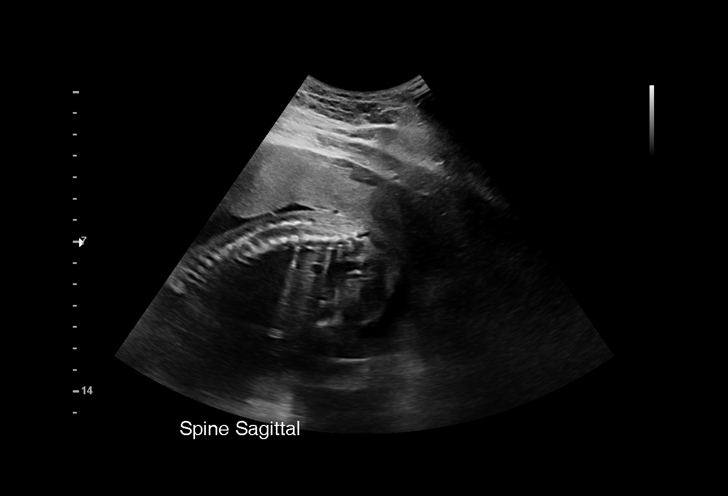
[im 90/122]
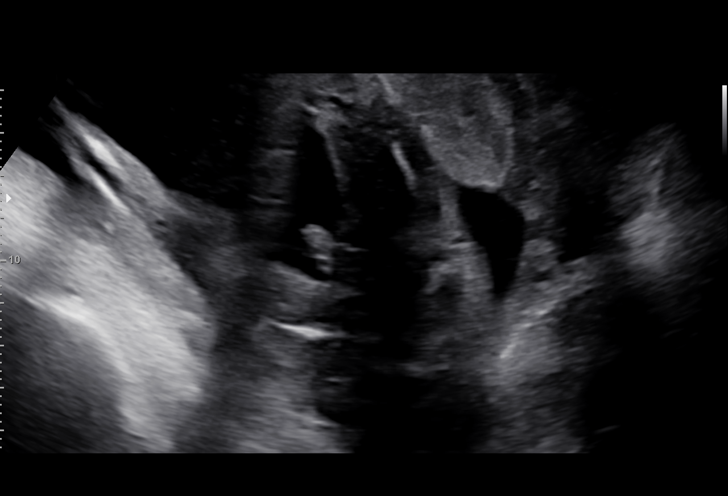
[im 99/122]
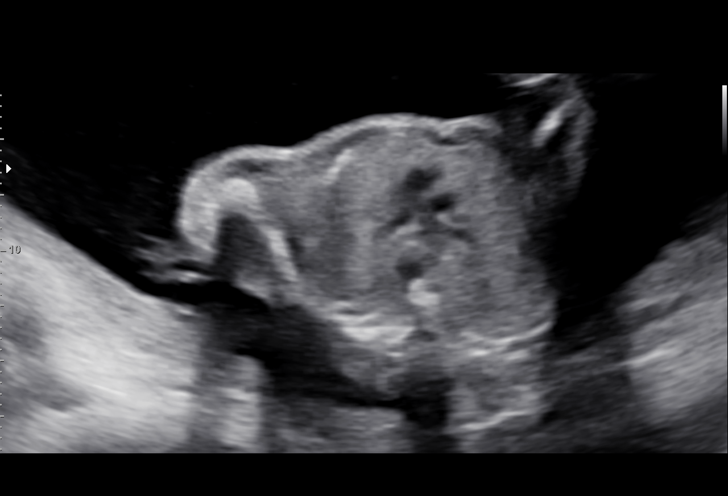
[im 108/122]
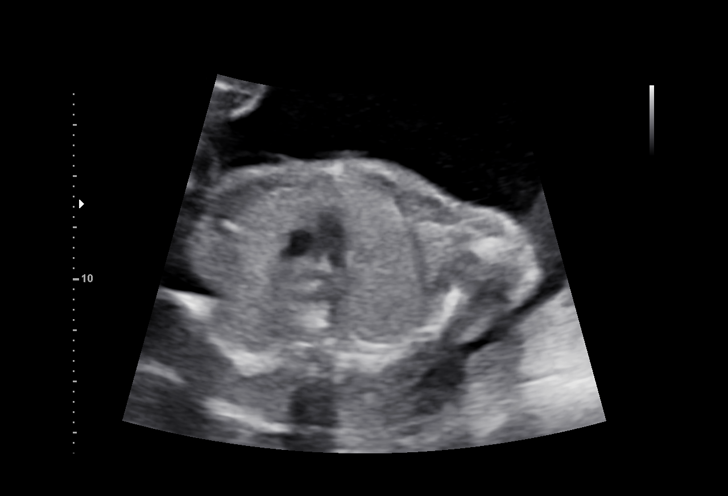
[im 117/122]
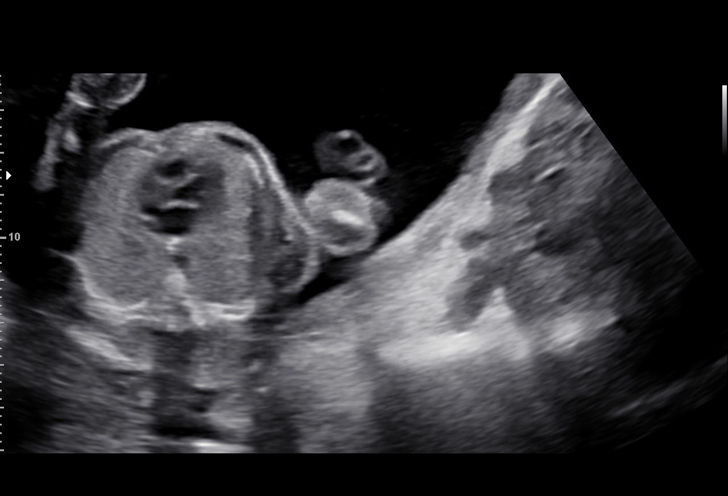

[13 of 28 positions shown; findings below may reference images not displayed]

1  US MFM OB DETAIL +14 WK               76811.01    KIRI JIM

Indications

 Obesity complicating pregnancy, second
 trimester (Pre Pregnancy BMI 39.89)
 23 weeks gestation of pregnancy
 Encounter for antenatal screening for
 malformations
 Neg 1st Tri Screening, neg CF, neg Horizon,
 Declined AFP)
Fetal Evaluation

 Num Of Fetuses:         1
 Fetal Heart Rate(bpm):  141
 Cardiac Activity:       Observed
 Presentation:           Variable
 Placenta:               Anterior
 P. Cord Insertion:      Visualized

 Amniotic Fluid
 AFI FV:      Within normal limits
Biometry

 BPD:      54.7  mm     G. Age:  22w 5d         22  %    CI:        75.38   %    70 - 86
                                                         FL/HC:      19.1   %    19.2 -
 HC:      199.8  mm     G. Age:  22w 1d        4.9  %    HC/AC:      1.06        1.05 -
 AC:      188.4  mm     G. Age:  23w 4d         52  %    FL/BPD:     69.7   %    71 - 87
 FL:       38.1  mm     G. Age:  22w 1d         10  %    FL/AC:      20.2   %    20 - 24
 HUM:      35.2  mm     G. Age:  22w 1d         18  %
 CER:      25.4  mm     G. Age:  23w 1d         67  %
 NFT:       5.1  mm

 LV:        6.5  mm
 CM:        5.2  mm

 Est. FW:     543  gm      1 lb 3 oz     24  %
OB History

 Gravidity:    1
Gestational Age

 LMP:           23w 2d        Date:  11/09/19                 EDD:   08/15/20
 U/S Today:     22w 5d                                        EDD:   08/19/20
 Best:          23w 2d     Det. By:  LMP  (11/09/19)          EDD:   08/15/20
Anatomy

 Cranium:               Appears normal         LVOT:                   Appears normal
 Cavum:                 Appears normal         Aortic Arch:            Not well visualized
 Ventricles:            Appears normal         Ductal Arch:            Appears normal
 Choroid Plexus:        Appears normal         Diaphragm:              Appears normal
 Cerebellum:            Appears normal         Stomach:                Appears normal, left
                                                                       sided
 Posterior Fossa:       Appears normal         Abdomen:                Appears normal
 Nuchal Fold:           Appears normal         Abdominal Wall:         Appears nml (cord
                                                                       insert, abd wall)
 Face:                  Appears normal         Cord Vessels:           Appears normal (3
                        (orbits and profile)                           vessel cord)
 Lips:                  Appears normal         Kidneys:                Appear normal
 Palate:                Not well visualized    Bladder:                Appears normal
 Thoracic:              Appears normal         Spine:                  Not well visualized
 Heart:                 Appears normal         Upper Extremities:      Appears normal
                        (4CH, axis, and
                        situs)
 RVOT:                  Appears normal         Lower Extremities:      Appears normal

 Other:  Fetus appears to be a male. Heels visualized. Open hand visualized.
         Hands not well visualized.  Nasal bone visualized. Technically difficult
         due to maternal habitus and fetal position.
Cervix Uterus Adnexa

 Cervix
 Length:            2.5  cm.
Comments

 This patient was seen for a detailed fetal anatomy scan due
 to maternal obesity.  The views of the fetal anatomy has been
 unable to be fully visualized during her prior ultrasound
 exams performed in your office.
 She denies any significant past medical history and denies
 any problems in her current pregnancy.
 She had a first trimester screening test indicating a final
 Down syndrome risk of 1 in 2872.
 She was informed that the fetal growth and amniotic fluid
 level were appropriate for her gestational age.
 There were no obvious fetal anomalies noted on today's
 ultrasound exam.  However, today's exam was limited due to
 maternal body habitus and the fetal position.
 The patient was informed that anomalies may be missed due
 to technical limitations. If the fetus is in a suboptimal position
 or maternal habitus is increased, visualization of the fetus in
 the maternal uterus may be impaired.
 The patient was offered the opportunity to return in a few
 weeks for us to reassess the fetal anatomy.  She declined to
 return as she was comfortable with the images that were
 visualized today.  We will be happy to see her again in the
 future if necessary.

## 2022-05-01 IMAGING — US US ABDOMEN LIMITED
1 series · 14 of 25 positions shown · non-contrast
Comparison: None.

CLINICAL DATA: Right upper quadrant pain

EXAM:
ULTRASOUND ABDOMEN LIMITED RIGHT UPPER QUADRANT

[Series 1: us abdomen limited ruq (liver/gb) · 14 of 65 slices shown]
[im 1/65]
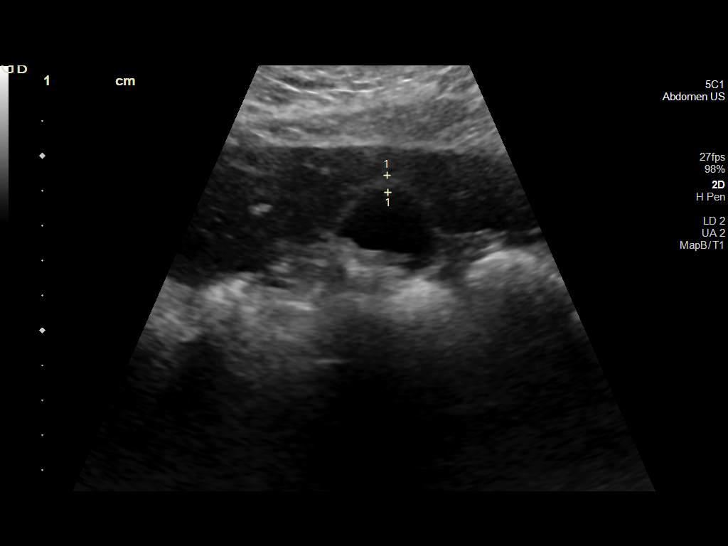
[im 6/65]
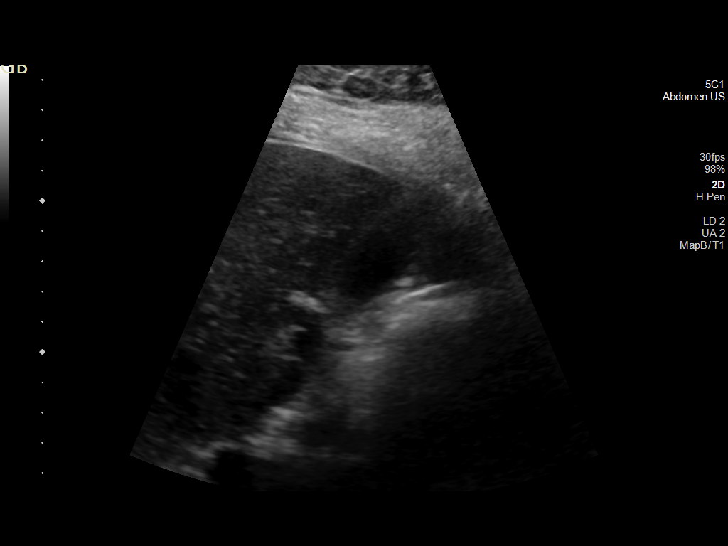
[im 11/65]
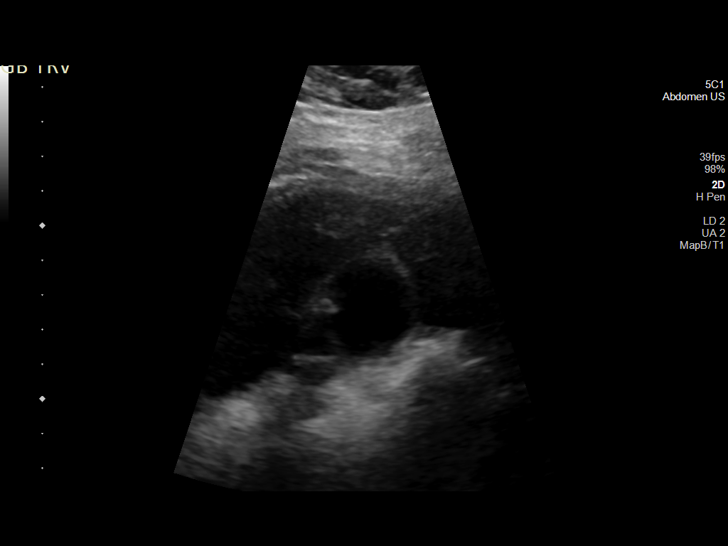
[im 17/65]
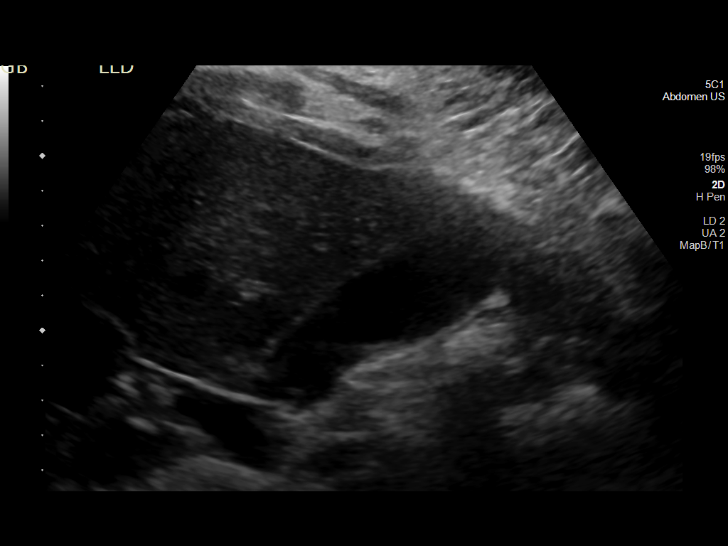
[im 22/65]
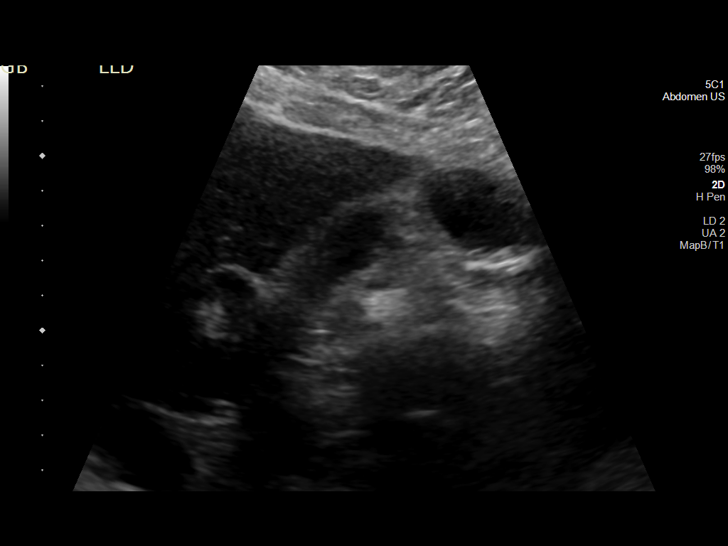
[im 25/65]
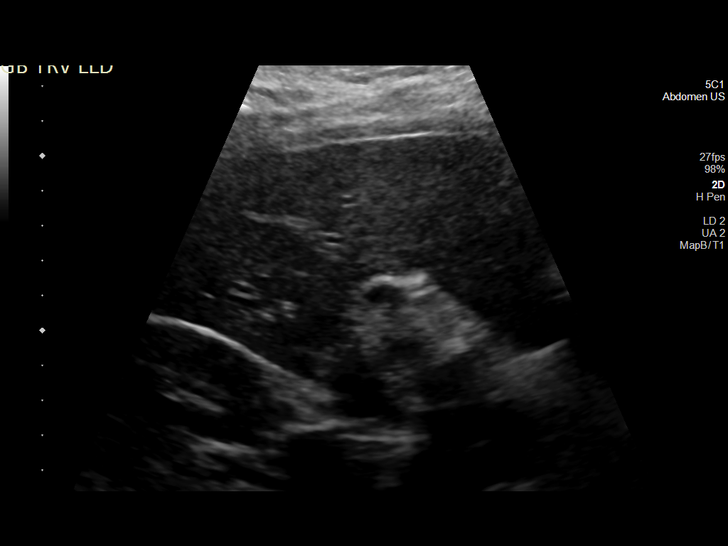
[im 30/65]
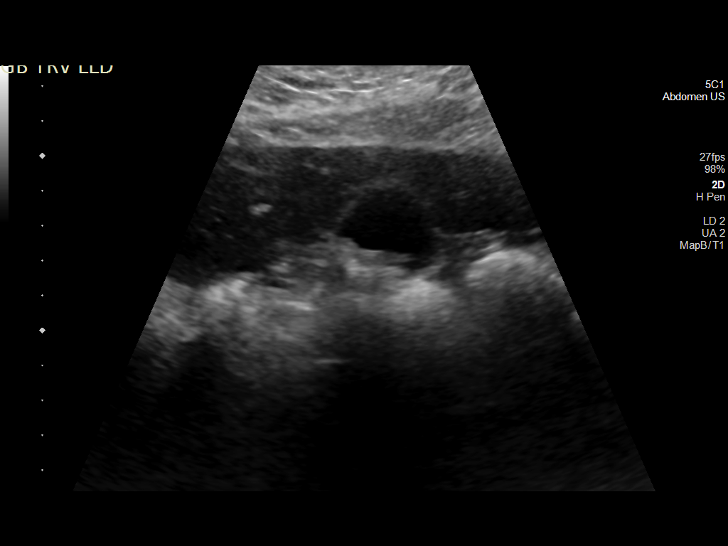
[im 35/65]
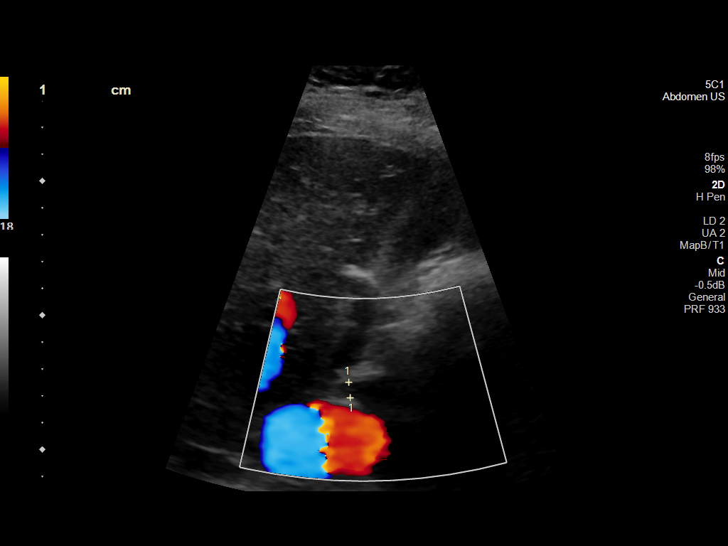
[im 41/65]
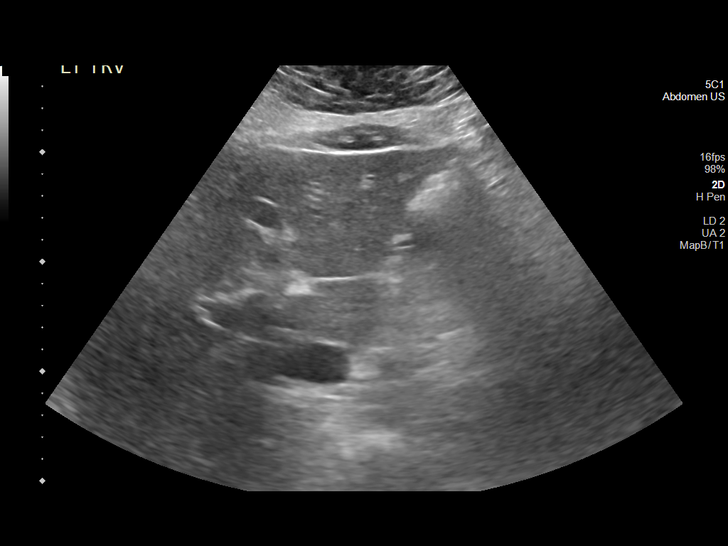
[im 43/65]
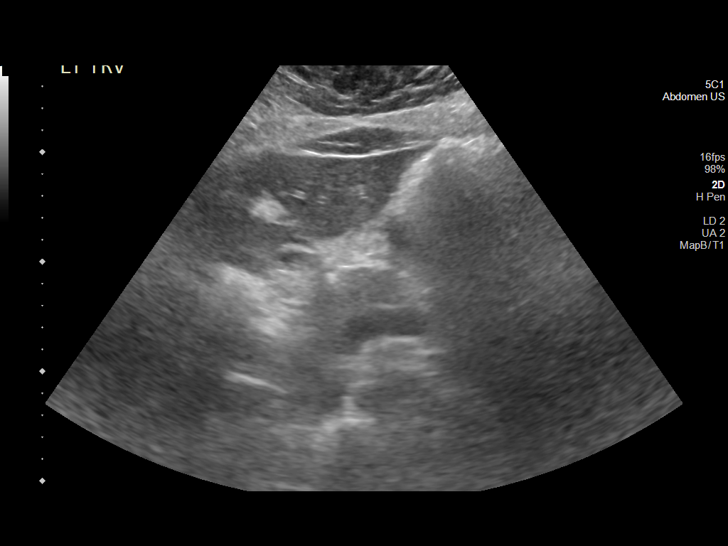
[im 49/65]
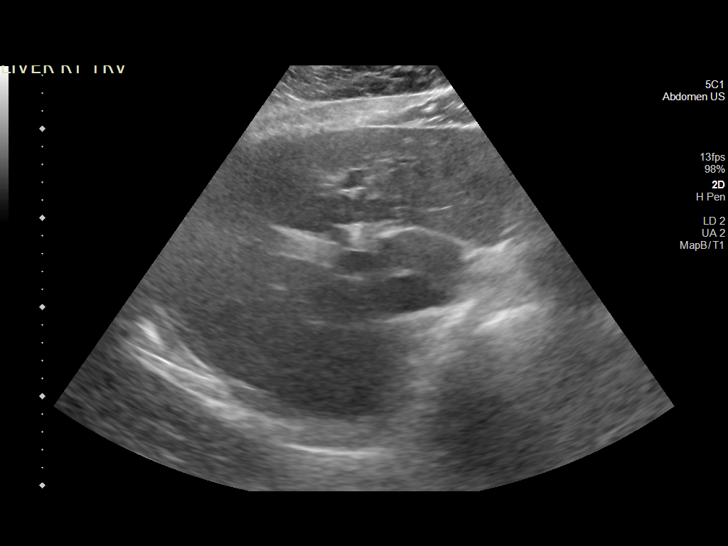
[im 54/65]
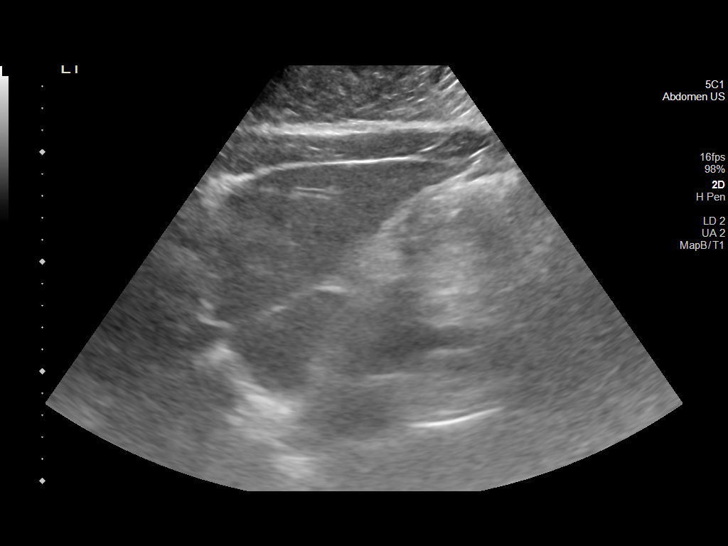
[im 59/65]
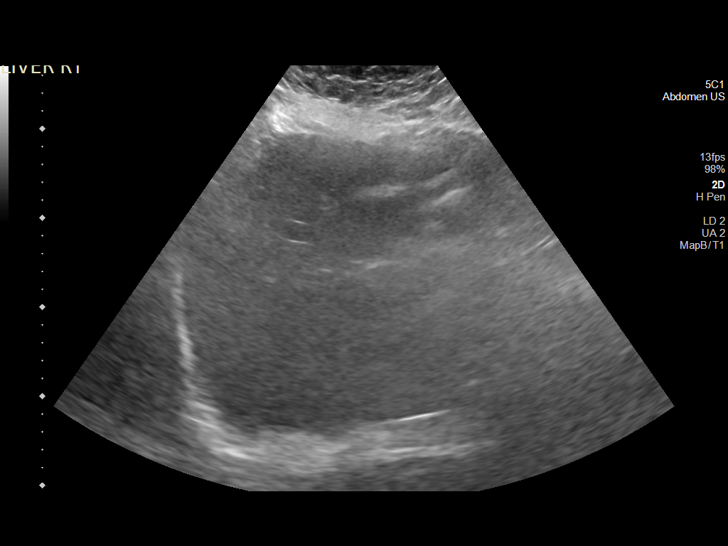
[im 65/65]
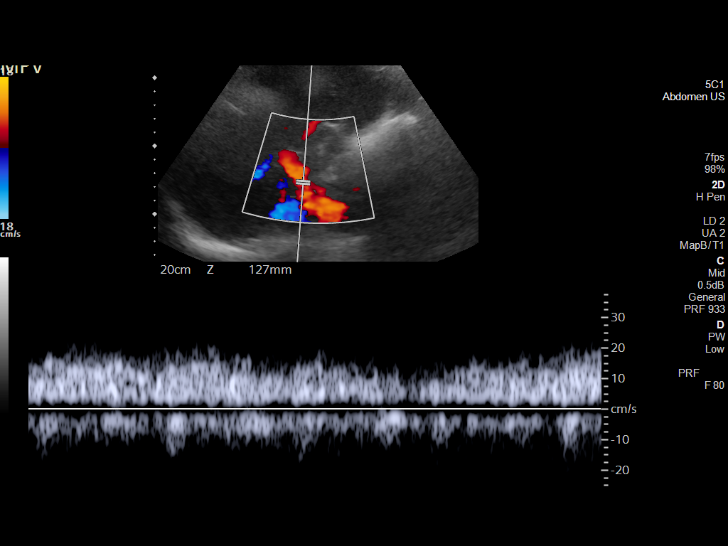

[14 of 25 positions shown; findings below may reference images not displayed]

FINDINGS: Gallbladder:

Gallbladder sludge with small stones. A negative sonographic Murphy
sign was reported by the sonographer. No pericholecystic fluid or
gallbladder wall thickening.

Common bile duct:

Diameter: 6 mm

Liver:

No focal lesion identified. Within normal limits in parenchymal
echogenicity. Portal vein is patent on color Doppler imaging with
normal direction of blood flow towards the liver.

Other: None.
IMPRESSION: Cholelithiasis without other evidence of acute cholecystitis.
